# Patient Record
Sex: Male | Born: 1990 | Race: Black or African American | Hispanic: No | Marital: Single
Health system: Southern US, Academic
[De-identification: ages and names within clinical notes are randomized; demographics above are authoritative.]

## PROBLEM LIST (undated history)

## (undated) ENCOUNTER — Encounter

## (undated) ENCOUNTER — Ambulatory Visit: Payer: MEDICARE | Attending: Infectious Disease | Primary: Infectious Disease

## (undated) ENCOUNTER — Ambulatory Visit: Payer: MEDICARE | Attending: Psychiatry | Primary: Psychiatry

## (undated) ENCOUNTER — Encounter: Attending: Infectious Disease | Primary: Infectious Disease

## (undated) ENCOUNTER — Telehealth

## (undated) ENCOUNTER — Encounter: Attending: Family Medicine | Primary: Family Medicine

## (undated) ENCOUNTER — Encounter: Attending: Psychiatry | Primary: Psychiatry

## (undated) ENCOUNTER — Ambulatory Visit

## (undated) ENCOUNTER — Encounter: Payer: MEDICARE | Attending: Infectious Disease | Primary: Infectious Disease

## (undated) ENCOUNTER — Other Ambulatory Visit
Attending: Student in an Organized Health Care Education/Training Program | Primary: Student in an Organized Health Care Education/Training Program

## (undated) ENCOUNTER — Ambulatory Visit: Payer: MEDICARE

## (undated) ENCOUNTER — Telehealth: Attending: Infectious Disease | Primary: Infectious Disease

## (undated) ENCOUNTER — Ambulatory Visit
Payer: Medicare (Managed Care) | Attending: Student in an Organized Health Care Education/Training Program | Primary: Student in an Organized Health Care Education/Training Program

## (undated) ENCOUNTER — Telehealth: Attending: Registered" | Primary: Registered"

## (undated) ENCOUNTER — Encounter
Payer: MEDICARE | Attending: Student in an Organized Health Care Education/Training Program | Primary: Student in an Organized Health Care Education/Training Program

## (undated) ENCOUNTER — Telehealth: Attending: Clinical | Primary: Clinical

## (undated) ENCOUNTER — Encounter: Attending: Clinical | Primary: Clinical

## (undated) ENCOUNTER — Encounter
Attending: Student in an Organized Health Care Education/Training Program | Primary: Student in an Organized Health Care Education/Training Program

## (undated) ENCOUNTER — Encounter: Payer: MEDICARE | Attending: Psychiatry | Primary: Psychiatry

## (undated) ENCOUNTER — Encounter: Attending: Family | Primary: Family

## (undated) ENCOUNTER — Ambulatory Visit: Payer: Medicare (Managed Care) | Attending: Infectious Disease | Primary: Infectious Disease

## (undated) ENCOUNTER — Ambulatory Visit
Payer: MEDICARE | Attending: Rehabilitative and Restorative Service Providers" | Primary: Rehabilitative and Restorative Service Providers"

## (undated) ENCOUNTER — Ambulatory Visit: Payer: MEDICARE | Attending: Family | Primary: Family

## (undated) ENCOUNTER — Encounter: Attending: Registered" | Primary: Registered"

## (undated) ENCOUNTER — Non-Acute Institutional Stay: Payer: MEDICARE | Attending: Infectious Disease | Primary: Infectious Disease

## (undated) ENCOUNTER — Other Ambulatory Visit

## (undated) ENCOUNTER — Non-Acute Institutional Stay: Payer: MEDICARE

## (undated) ENCOUNTER — Encounter: Payer: MEDICARE | Attending: Family Medicine | Primary: Family Medicine

## (undated) ENCOUNTER — Telehealth
Attending: Pharmacist Clinician (PhC)/ Clinical Pharmacy Specialist | Primary: Pharmacist Clinician (PhC)/ Clinical Pharmacy Specialist

## (undated) DIAGNOSIS — B20 Human immunodeficiency virus [HIV] disease: Secondary | ICD-10-CM

---
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
## 2016-08-24 ENCOUNTER — Emergency Department
Admission: EM | Admit: 2016-08-24 | Discharge: 2016-08-24 | Disposition: A | Discharge status: Home / Self Care | Payer: Worker's Compensation | Source: Intra-hospital

## 2016-12-01 ENCOUNTER — Ambulatory Visit
Admission: RE | Admit: 2016-12-01 | Discharge: 2016-12-01 | Disposition: A | Discharge status: Home / Self Care | Admitting: Infectious Disease

## 2016-12-01 DIAGNOSIS — R198 Other specified symptoms and signs involving the digestive system and abdomen: Secondary | ICD-10-CM

## 2016-12-01 DIAGNOSIS — Z23 Encounter for immunization: Principal | ICD-10-CM

## 2016-12-01 DIAGNOSIS — R799 Abnormal finding of blood chemistry, unspecified: Secondary | ICD-10-CM

## 2016-12-01 DIAGNOSIS — Z7289 Other problems related to lifestyle: Secondary | ICD-10-CM

## 2016-12-01 DIAGNOSIS — B20 Human immunodeficiency virus [HIV] disease: Secondary | ICD-10-CM

## 2016-12-01 DIAGNOSIS — Z113 Encounter for screening for infections with a predominantly sexual mode of transmission: Secondary | ICD-10-CM

## 2016-12-01 DIAGNOSIS — Z79899 Other long term (current) drug therapy: Secondary | ICD-10-CM

## 2016-12-01 MED ORDER — DOLUTEGRAVIR 50 MG TABLET
ORAL_TABLET | Freq: Every day | ORAL | 11 refills | 0 days | Status: CP
Start: 2016-12-01 — End: 2017-12-19

## 2016-12-01 MED ORDER — EMTRICITABINE 200 MG-TENOFOVIR ALAFENAMIDE FUMARATE 25 MG TABLET
ORAL_TABLET | Freq: Every day | ORAL | 11 refills | 0 days | Status: CP
Start: 2016-12-01 — End: 2017-12-19

## 2016-12-04 ENCOUNTER — Ambulatory Visit: Admission: RE | Admit: 2016-12-04 | Discharge: 2016-12-04 | Payer: MEDICARE

## 2016-12-04 DIAGNOSIS — A539 Syphilis, unspecified: Principal | ICD-10-CM

## 2017-01-23 ENCOUNTER — Emergency Department
Admission: EM | Admit: 2017-01-23 | Discharge: 2017-01-23 | Disposition: A | Discharge status: Home / Self Care | Source: Intra-hospital

## 2017-01-23 MED ORDER — OMEPRAZOLE 20 MG CAPSULE,DELAYED RELEASE
ORAL_CAPSULE | Freq: Every day | ORAL | 0 refills | 0.00000 days | Status: CP
Start: 2017-01-23 — End: 2017-03-02

## 2017-02-05 ENCOUNTER — Ambulatory Visit
Admission: RE | Admit: 2017-02-05 | Discharge: 2017-02-05 | Attending: Student in an Organized Health Care Education/Training Program | Admitting: Student in an Organized Health Care Education/Training Program

## 2017-02-05 DIAGNOSIS — F418 Other specified anxiety disorders: Secondary | ICD-10-CM

## 2017-02-05 DIAGNOSIS — B20 Human immunodeficiency virus [HIV] disease: Secondary | ICD-10-CM

## 2017-02-05 DIAGNOSIS — A5149 Other secondary syphilitic conditions: Principal | ICD-10-CM

## 2017-02-25 ENCOUNTER — Encounter: Admit: 2017-02-25 | Discharge: 2017-02-26 | Payer: MEDICARE

## 2017-02-25 DIAGNOSIS — R42 Dizziness and giddiness: Principal | ICD-10-CM

## 2017-02-25 DIAGNOSIS — B20 Human immunodeficiency virus [HIV] disease: Secondary | ICD-10-CM

## 2017-02-25 DIAGNOSIS — R269 Unspecified abnormalities of gait and mobility: Secondary | ICD-10-CM

## 2017-02-25 DIAGNOSIS — H547 Unspecified visual loss: Secondary | ICD-10-CM

## 2017-03-02 ENCOUNTER — Encounter
Admit: 2017-03-02 | Discharge: 2017-03-03 | Payer: MEDICARE | Attending: Infectious Disease | Primary: Infectious Disease

## 2017-03-02 DIAGNOSIS — H539 Unspecified visual disturbance: Secondary | ICD-10-CM

## 2017-03-02 DIAGNOSIS — F418 Other specified anxiety disorders: Secondary | ICD-10-CM

## 2017-03-02 DIAGNOSIS — A519 Early syphilis, unspecified: Secondary | ICD-10-CM

## 2017-03-02 DIAGNOSIS — Z113 Encounter for screening for infections with a predominantly sexual mode of transmission: Secondary | ICD-10-CM

## 2017-03-02 DIAGNOSIS — K219 Gastro-esophageal reflux disease without esophagitis: Secondary | ICD-10-CM

## 2017-03-02 DIAGNOSIS — R42 Dizziness and giddiness: Secondary | ICD-10-CM

## 2017-03-02 DIAGNOSIS — B20 Human immunodeficiency virus [HIV] disease: Principal | ICD-10-CM

## 2017-03-02 DIAGNOSIS — Z7289 Other problems related to lifestyle: Secondary | ICD-10-CM

## 2017-03-02 MED ORDER — FAMOTIDINE 40 MG TABLET
ORAL_TABLET | Freq: Every day | ORAL | 5 refills | 0.00000 days | Status: CP
Start: 2017-03-02 — End: 2017-03-10

## 2017-03-06 ENCOUNTER — Encounter
Admit: 2017-03-06 | Discharge: 2017-03-07 | Payer: MEDICARE | Attending: Student in an Organized Health Care Education/Training Program | Primary: Student in an Organized Health Care Education/Training Program

## 2017-03-06 DIAGNOSIS — F39 Unspecified mood [affective] disorder: Secondary | ICD-10-CM

## 2017-03-06 DIAGNOSIS — B20 Human immunodeficiency virus [HIV] disease: Secondary | ICD-10-CM

## 2017-03-06 DIAGNOSIS — A5149 Other secondary syphilitic conditions: Principal | ICD-10-CM

## 2017-03-06 DIAGNOSIS — F418 Other specified anxiety disorders: Secondary | ICD-10-CM

## 2017-03-10 MED ORDER — OMEPRAZOLE MAGNESIUM 20 MG TABLET,DELAYED RELEASE
ORAL_TABLET | Freq: Every day | ORAL | 11 refills | 0 days | Status: CP
Start: 2017-03-10 — End: 2017-12-08

## 2017-03-13 ENCOUNTER — Encounter: Admit: 2017-03-13 | Discharge: 2017-03-14 | Payer: MEDICARE

## 2017-03-13 DIAGNOSIS — Q120 Congenital cataract: Secondary | ICD-10-CM

## 2017-03-13 DIAGNOSIS — B20 Human immunodeficiency virus [HIV] disease: Principal | ICD-10-CM

## 2017-05-25 ENCOUNTER — Encounter
Admit: 2017-05-25 | Discharge: 2017-05-26 | Disposition: A | Discharge status: Home / Self Care | Payer: MEDICARE | Attending: Family

## 2017-05-25 MED ORDER — CYCLOBENZAPRINE 10 MG TABLET
ORAL_TABLET | Freq: Two times a day (BID) | ORAL | 0 refills | 0 days | Status: CP | PRN
Start: 2017-05-25 — End: 2017-06-22

## 2017-05-28 ENCOUNTER — Encounter: Admit: 2017-05-28 | Discharge: 2017-05-29 | Payer: MEDICARE

## 2017-05-28 DIAGNOSIS — M545 Low back pain: Principal | ICD-10-CM

## 2017-05-28 DIAGNOSIS — S060X0A Concussion without loss of consciousness, initial encounter: Secondary | ICD-10-CM

## 2017-06-05 ENCOUNTER — Encounter: Admit: 2017-06-05 | Discharge: 2017-06-06 | Payer: MEDICARE | Attending: Family Medicine | Primary: Family Medicine

## 2017-06-05 DIAGNOSIS — M545 Low back pain: Principal | ICD-10-CM

## 2017-06-05 MED ORDER — GABAPENTIN 300 MG CAPSULE
ORAL_CAPSULE | Freq: Every evening | ORAL | 0 refills | 0 days | Status: CP
Start: 2017-06-05 — End: 2017-07-29

## 2017-06-22 ENCOUNTER — Encounter
Admit: 2017-06-22 | Discharge: 2017-06-23 | Payer: MEDICARE | Attending: Student in an Organized Health Care Education/Training Program | Primary: Student in an Organized Health Care Education/Training Program

## 2017-06-22 DIAGNOSIS — M545 Low back pain: Principal | ICD-10-CM

## 2017-06-22 MED ORDER — DULOXETINE 30 MG CAPSULE,DELAYED RELEASE
ORAL_CAPSULE | Freq: Every day | ORAL | 5 refills | 0 days | Status: CP
Start: 2017-06-22 — End: 2017-07-29

## 2017-06-22 MED ORDER — TRAMADOL 50 MG TABLET
ORAL_TABLET | Freq: Four times a day (QID) | ORAL | 0 refills | 0 days | Status: CP | PRN
Start: 2017-06-22 — End: 2017-06-27

## 2017-07-20 ENCOUNTER — Encounter
Admit: 2017-07-20 | Discharge: 2017-08-18 | Payer: MEDICARE | Attending: Rehabilitative and Restorative Service Providers" | Primary: Rehabilitative and Restorative Service Providers"

## 2017-07-20 DIAGNOSIS — M545 Low back pain: Principal | ICD-10-CM

## 2017-07-29 ENCOUNTER — Encounter
Admit: 2017-07-29 | Discharge: 2017-07-30 | Payer: MEDICARE | Attending: Student in an Organized Health Care Education/Training Program | Primary: Student in an Organized Health Care Education/Training Program

## 2017-07-29 DIAGNOSIS — M545 Low back pain: Principal | ICD-10-CM

## 2017-08-12 DIAGNOSIS — M545 Low back pain: Principal | ICD-10-CM

## 2017-12-03 ENCOUNTER — Encounter
Admit: 2017-12-03 | Discharge: 2017-12-03 | Disposition: A | Discharge status: Home / Self Care | Payer: MEDICARE | Attending: Student in an Organized Health Care Education/Training Program

## 2017-12-03 MED ORDER — NAPROXEN 375 MG TABLET
ORAL_TABLET | Freq: Two times a day (BID) | ORAL | 0 refills | 0 days | Status: CP
Start: 2017-12-03 — End: 2017-12-08

## 2017-12-03 MED ORDER — CYCLOBENZAPRINE 10 MG TABLET
ORAL_TABLET | Freq: Two times a day (BID) | ORAL | 0 refills | 0.00000 days | Status: CP | PRN
Start: 2017-12-03 — End: 2018-02-22

## 2017-12-06 ENCOUNTER — Encounter: Admit: 2017-12-06 | Discharge: 2017-12-06 | Disposition: A | Discharge status: Home / Self Care | Payer: MEDICARE

## 2017-12-08 ENCOUNTER — Encounter: Admit: 2017-12-08 | Discharge: 2017-12-09 | Payer: MEDICARE

## 2017-12-08 DIAGNOSIS — S8392XA Sprain of unspecified site of left knee, initial encounter: Secondary | ICD-10-CM

## 2017-12-08 DIAGNOSIS — S335XXA Sprain of ligaments of lumbar spine, initial encounter: Principal | ICD-10-CM

## 2017-12-19 ENCOUNTER — Encounter
Admit: 2017-12-19 | Discharge: 2017-12-20 | Disposition: A | Discharge status: Home / Self Care | Payer: MEDICARE | Attending: Emergency Medicine

## 2017-12-19 DIAGNOSIS — M7918 Myalgia, other site: Principal | ICD-10-CM

## 2017-12-21 MED ORDER — DESCOVY 200 MG-25 MG TABLET
ORAL_TABLET | 11 refills | 0 days | Status: CP
Start: 2017-12-21 — End: 2018-01-27

## 2017-12-21 MED ORDER — TIVICAY 50 MG TABLET
ORAL_TABLET | 11 refills | 0 days | Status: CP
Start: 2017-12-21 — End: 2018-01-27

## 2017-12-22 ENCOUNTER — Ambulatory Visit: Admit: 2017-12-22 | Discharge: 2017-12-23 | Payer: MEDICARE | Attending: Family | Primary: Family

## 2017-12-22 DIAGNOSIS — J392 Other diseases of pharynx: Secondary | ICD-10-CM

## 2017-12-22 DIAGNOSIS — H5789 Other specified disorders of eye and adnexa: Secondary | ICD-10-CM

## 2017-12-22 DIAGNOSIS — M549 Dorsalgia, unspecified: Principal | ICD-10-CM

## 2018-01-27 ENCOUNTER — Encounter: Admit: 2018-01-27 | Discharge: 2018-01-28 | Payer: MEDICARE

## 2018-01-27 DIAGNOSIS — K6289 Other specified diseases of anus and rectum: Secondary | ICD-10-CM

## 2018-01-27 DIAGNOSIS — F39 Unspecified mood [affective] disorder: Secondary | ICD-10-CM

## 2018-01-27 DIAGNOSIS — Z113 Encounter for screening for infections with a predominantly sexual mode of transmission: Secondary | ICD-10-CM

## 2018-01-27 DIAGNOSIS — B20 Human immunodeficiency virus [HIV] disease: Principal | ICD-10-CM

## 2018-01-27 DIAGNOSIS — F603 Borderline personality disorder: Secondary | ICD-10-CM

## 2018-01-27 MED ORDER — BICTEGRAVIR 50 MG-EMTRICITABINE 200 MG-TENOFOVIR ALAFENAM 25 MG TABLET
ORAL_TABLET | Freq: Every day | ORAL | 11 refills | 0 days | Status: CP
Start: 2018-01-27 — End: 2018-09-13

## 2018-02-22 ENCOUNTER — Ambulatory Visit
Admit: 2018-02-22 | Discharge: 2018-02-23 | Payer: MEDICARE | Attending: Infectious Disease | Primary: Infectious Disease

## 2018-02-22 DIAGNOSIS — B20 Human immunodeficiency virus [HIV] disease: Principal | ICD-10-CM

## 2018-02-22 DIAGNOSIS — Z79899 Other long term (current) drug therapy: Secondary | ICD-10-CM

## 2018-02-22 DIAGNOSIS — Z23 Encounter for immunization: Secondary | ICD-10-CM

## 2018-02-22 DIAGNOSIS — F1290 Cannabis use, unspecified, uncomplicated: Secondary | ICD-10-CM

## 2018-06-14 ENCOUNTER — Encounter
Admit: 2018-06-14 | Discharge: 2018-06-15 | Payer: MEDICARE | Attending: Infectious Disease | Primary: Infectious Disease

## 2018-06-14 DIAGNOSIS — B20 Human immunodeficiency virus [HIV] disease: Principal | ICD-10-CM

## 2018-06-14 DIAGNOSIS — Z79899 Other long term (current) drug therapy: Secondary | ICD-10-CM

## 2018-06-14 DIAGNOSIS — Z113 Encounter for screening for infections with a predominantly sexual mode of transmission: Secondary | ICD-10-CM

## 2018-06-14 DIAGNOSIS — Z23 Encounter for immunization: Secondary | ICD-10-CM

## 2018-06-14 DIAGNOSIS — A549 Gonococcal infection, unspecified: Secondary | ICD-10-CM

## 2018-06-14 DIAGNOSIS — K219 Gastro-esophageal reflux disease without esophagitis: Secondary | ICD-10-CM

## 2018-06-14 MED ORDER — OMEPRAZOLE 10 MG CAPSULE,DELAYED RELEASE: 10 mg | capsule | Freq: Every day | 2 refills | 0 days | Status: AC

## 2018-06-14 MED ORDER — OMEPRAZOLE 10 MG CAPSULE,DELAYED RELEASE
ORAL_CAPSULE | Freq: Every day | ORAL | 3 refills | 0.00000 days | Status: CP
Start: 2018-06-14 — End: 2018-06-14

## 2018-06-16 ENCOUNTER — Encounter: Admit: 2018-06-16 | Discharge: 2018-06-17 | Payer: MEDICARE

## 2018-06-16 ENCOUNTER — Institutional Professional Consult (permissible substitution): Admit: 2018-06-16 | Discharge: 2018-06-17 | Payer: MEDICARE

## 2018-06-16 DIAGNOSIS — B20 Human immunodeficiency virus [HIV] disease: Principal | ICD-10-CM

## 2018-06-16 DIAGNOSIS — Z113 Encounter for screening for infections with a predominantly sexual mode of transmission: Principal | ICD-10-CM

## 2018-06-16 DIAGNOSIS — Z79899 Other long term (current) drug therapy: Secondary | ICD-10-CM

## 2018-06-16 DIAGNOSIS — Z23 Encounter for immunization: Secondary | ICD-10-CM

## 2018-09-13 ENCOUNTER — Encounter
Admit: 2018-09-13 | Discharge: 2018-09-14 | Payer: MEDICARE | Attending: Infectious Disease | Primary: Infectious Disease

## 2018-09-13 DIAGNOSIS — Z79899 Other long term (current) drug therapy: Secondary | ICD-10-CM

## 2018-09-13 DIAGNOSIS — F418 Other specified anxiety disorders: Secondary | ICD-10-CM

## 2018-09-13 DIAGNOSIS — K219 Gastro-esophageal reflux disease without esophagitis: Secondary | ICD-10-CM

## 2018-09-13 DIAGNOSIS — B20 Human immunodeficiency virus [HIV] disease: Principal | ICD-10-CM

## 2018-09-13 DIAGNOSIS — F1290 Cannabis use, unspecified, uncomplicated: Secondary | ICD-10-CM

## 2018-09-13 DIAGNOSIS — Z7289 Other problems related to lifestyle: Secondary | ICD-10-CM

## 2018-09-13 MED ORDER — BICTEGRAVIR 50 MG-EMTRICITABINE 200 MG-TENOFOVIR ALAFENAM 25 MG TABLET
ORAL_TABLET | Freq: Every day | ORAL | 11 refills | 30.00000 days | Status: CP
Start: 2018-09-13 — End: ?

## 2018-09-26 DIAGNOSIS — A539 Syphilis, unspecified: Secondary | ICD-10-CM | POA: Insufficient documentation

## 2018-09-26 DIAGNOSIS — S0181XA Laceration without foreign body of other part of head, initial encounter: Secondary | ICD-10-CM

## 2018-09-26 DIAGNOSIS — S41112A Laceration without foreign body of left upper arm, initial encounter: Secondary | ICD-10-CM

## 2018-09-26 DIAGNOSIS — Z91419 Personal history of unspecified adult abuse: Secondary | ICD-10-CM | POA: Insufficient documentation

## 2018-09-26 HISTORY — DX: Laceration without foreign body of left upper arm, initial encounter: S41.112A

## 2018-09-26 HISTORY — DX: Laceration without foreign body of other part of head, initial encounter: S01.81XA

## 2018-10-12 ENCOUNTER — Encounter: Admit: 2018-10-12 | Discharge: 2018-10-13 | Payer: MEDICARE

## 2018-10-12 DIAGNOSIS — B20 Human immunodeficiency virus [HIV] disease: Principal | ICD-10-CM

## 2018-10-12 DIAGNOSIS — S8392XA Sprain of unspecified site of left knee, initial encounter: Secondary | ICD-10-CM

## 2018-10-12 DIAGNOSIS — S335XXA Sprain of ligaments of lumbar spine, initial encounter: Secondary | ICD-10-CM

## 2018-10-12 DIAGNOSIS — G8911 Acute pain due to trauma: Secondary | ICD-10-CM

## 2018-10-12 DIAGNOSIS — S31041D Puncture wound with foreign body of lower back and pelvis with penetration into retroperitoneum, subsequent encounter: Secondary | ICD-10-CM

## 2018-10-12 HISTORY — DX: Acute pain due to trauma: G89.11

## 2018-10-12 HISTORY — DX: Puncture wound with foreign body of lower back and pelvis with penetration into retroperitoneum, subsequent encounter: S31.041D

## 2018-11-01 ENCOUNTER — Encounter
Admit: 2018-11-01 | Discharge: 2018-11-02 | Payer: MEDICARE | Attending: Infectious Disease | Primary: Infectious Disease

## 2018-11-01 DIAGNOSIS — F4311 Post-traumatic stress disorder, acute: Secondary | ICD-10-CM

## 2018-11-01 DIAGNOSIS — T7491XA Unspecified adult maltreatment, confirmed, initial encounter: Secondary | ICD-10-CM

## 2018-11-01 DIAGNOSIS — B20 Human immunodeficiency virus [HIV] disease: Secondary | ICD-10-CM

## 2018-11-01 DIAGNOSIS — F5102 Adjustment insomnia: Secondary | ICD-10-CM

## 2018-11-01 DIAGNOSIS — Z23 Encounter for immunization: Secondary | ICD-10-CM

## 2018-11-01 MED ORDER — BICTEGRAVIR 50 MG-EMTRICITABINE 200 MG-TENOFOVIR ALAFENAM 25 MG TABLET
ORAL_TABLET | Freq: Every day | ORAL | 11 refills | 30 days | Status: CP
Start: 2018-11-01 — End: ?

## 2018-11-01 MED ORDER — ZOLPIDEM 5 MG TABLET
ORAL_TABLET | Freq: Every day | ORAL | 1 refills | 7 days | Status: CP
Start: 2018-11-01 — End: 2018-11-12

## 2018-11-12 ENCOUNTER — Ambulatory Visit: Admit: 2018-11-12 | Discharge: 2018-11-13 | Payer: MEDICARE | Attending: Psychiatry | Primary: Psychiatry

## 2018-11-12 DIAGNOSIS — F418 Other specified anxiety disorders: Secondary | ICD-10-CM

## 2018-11-12 DIAGNOSIS — F4311 Post-traumatic stress disorder, acute: Secondary | ICD-10-CM

## 2018-11-12 DIAGNOSIS — F1290 Cannabis use, unspecified, uncomplicated: Secondary | ICD-10-CM

## 2018-11-12 DIAGNOSIS — T7491XA Unspecified adult maltreatment, confirmed, initial encounter: Secondary | ICD-10-CM

## 2018-11-12 DIAGNOSIS — F5102 Adjustment insomnia: Secondary | ICD-10-CM

## 2018-11-12 DIAGNOSIS — F4312 Post-traumatic stress disorder, chronic: Secondary | ICD-10-CM

## 2018-11-12 DIAGNOSIS — B20 Human immunodeficiency virus [HIV] disease: Secondary | ICD-10-CM

## 2018-11-12 MED ORDER — PRAZOSIN 1 MG CAPSULE
ORAL_CAPSULE | 0 refills | 0 days | Status: CP
Start: 2018-11-12 — End: ?

## 2018-11-12 MED ORDER — CITALOPRAM 20 MG TABLET
ORAL_TABLET | 0 refills | 0 days | Status: CP
Start: 2018-11-12 — End: ?

## 2018-12-03 ENCOUNTER — Telehealth: Admit: 2018-12-03 | Discharge: 2018-12-04 | Payer: MEDICARE

## 2018-12-03 MED ORDER — CITALOPRAM 20 MG TABLET: 20 mg | tablet | Freq: Every day | 0 refills | 30 days | Status: AC

## 2019-03-06 MED ORDER — CITALOPRAM 20 MG TABLET
ORAL_TABLET | 0 refills | 0 days | Status: CP
Start: 2019-03-06 — End: ?

## 2019-03-17 ENCOUNTER — Emergency Department
Admission: EM | Admit: 2019-03-17 | Discharge: 2019-03-17 | Disposition: A | Discharge status: Home / Self Care | Payer: Medicare HMO | Attending: Student | Admitting: Student

## 2019-03-17 ENCOUNTER — Encounter: Payer: Self-pay | Admitting: Emergency Medicine

## 2019-03-17 ENCOUNTER — Other Ambulatory Visit: Payer: Self-pay

## 2019-03-17 DIAGNOSIS — Z79899 Other long term (current) drug therapy: Secondary | ICD-10-CM | POA: Insufficient documentation

## 2019-03-17 DIAGNOSIS — Z21 Asymptomatic human immunodeficiency virus [HIV] infection status: Secondary | ICD-10-CM | POA: Insufficient documentation

## 2019-03-17 DIAGNOSIS — F4325 Adjustment disorder with mixed disturbance of emotions and conduct: Secondary | ICD-10-CM | POA: Diagnosis not present

## 2019-03-17 DIAGNOSIS — F919 Conduct disorder, unspecified: Secondary | ICD-10-CM | POA: Diagnosis present

## 2019-03-17 DIAGNOSIS — F4310 Post-traumatic stress disorder, unspecified: Secondary | ICD-10-CM | POA: Insufficient documentation

## 2019-03-17 DIAGNOSIS — R45851 Suicidal ideations: Secondary | ICD-10-CM | POA: Insufficient documentation

## 2019-03-17 DIAGNOSIS — R4689 Other symptoms and signs involving appearance and behavior: Secondary | ICD-10-CM

## 2019-03-17 LAB — CBC
HCT: 41.4 % (ref 39.0–52.0)
Hemoglobin: 13.3 g/dL (ref 13.0–17.0)
MCH: 25 pg — ABNORMAL LOW (ref 26.0–34.0)
MCHC: 32.1 g/dL (ref 30.0–36.0)
MCV: 77.7 fL — ABNORMAL LOW (ref 80.0–100.0)
Platelets: 251 10*3/uL (ref 150–400)
RBC: 5.33 MIL/uL (ref 4.22–5.81)
RDW: 18.2 % — ABNORMAL HIGH (ref 11.5–15.5)
WBC: 6 10*3/uL (ref 4.0–10.5)
nRBC: 0 % (ref 0.0–0.2)

## 2019-03-17 LAB — URINE DRUG SCREEN, QUALITATIVE (ARMC ONLY)
Amphetamines, Ur Screen: NOT DETECTED
Barbiturates, Ur Screen: NOT DETECTED
Benzodiazepine, Ur Scrn: NOT DETECTED
Cannabinoid 50 Ng, Ur ~~LOC~~: POSITIVE — AB
Cocaine Metabolite,Ur ~~LOC~~: NOT DETECTED
MDMA (Ecstasy)Ur Screen: NOT DETECTED
Methadone Scn, Ur: NOT DETECTED
Opiate, Ur Screen: NOT DETECTED
Phencyclidine (PCP) Ur S: NOT DETECTED
Tricyclic, Ur Screen: NOT DETECTED

## 2019-03-17 LAB — COMPREHENSIVE METABOLIC PANEL
ALT: 20 U/L (ref 0–44)
AST: 29 U/L (ref 15–41)
Albumin: 4.7 g/dL (ref 3.5–5.0)
Alkaline Phosphatase: 51 U/L (ref 38–126)
Anion gap: 6 (ref 5–15)
BUN: 15 mg/dL (ref 6–20)
CO2: 26 mmol/L (ref 22–32)
Calcium: 9.3 mg/dL (ref 8.9–10.3)
Chloride: 106 mmol/L (ref 98–111)
Creatinine, Ser: 0.84 mg/dL (ref 0.61–1.24)
GFR calc Af Amer: >60 mL/min (ref >60)
GFR calc non Af Amer: >60 mL/min (ref >60)
Glucose, Bld: 96 mg/dL (ref 70–99)
Potassium: 3.6 mmol/L (ref 3.5–5.1)
Sodium: 138 mmol/L (ref 135–145)
Total Bilirubin: 1.6 mg/dL — ABNORMAL HIGH (ref 0.3–1.2)
Total Protein: 7.3 g/dL (ref 6.5–8.1)

## 2019-03-17 LAB — SALICYLATE LEVEL: Salicylate Lvl: <7 mg/dL — ABNORMAL LOW (ref 7.0–30.0)

## 2019-03-17 LAB — ETHANOL: Alcohol, Ethyl (B): <10 mg/dL (ref <10)

## 2019-03-17 LAB — ACETAMINOPHEN LEVEL: Acetaminophen (Tylenol), Serum: <10 ug/mL — ABNORMAL LOW (ref 10–30)

## 2019-03-17 NOTE — Discharge Instructions (Addendum)
Follow-up with your regular doctor and psychiatrist.    Please continue to take your regularly prescribed medications.  Return to the ER for new or recurrent thoughts of suicide, any other thoughts of wanting to hurt yourself or anyone else, or any other new or worsening symptoms that concern you.
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
## 2019-03-17 NOTE — ED Notes (Signed)
IVC, pend SOC 

## 2019-03-17 NOTE — ED Notes (Addendum)
Black wallet (pt states he doesn't need this locked up), gray jeans, black tennis shoes, gray socks, gray beanie, car keys, purple shorts, black belt, gray and black sweater, red t shirt, white t shirt, white wife beater, one earring (other is at home), striped underwear, placed in hospital belongings bag, black iphone (turned off), labled and given to quad RN. 4 bottles of medication placed in belongings bag as well and RN made aware.

## 2019-03-17 NOTE — ED Notes (Signed)
Reviewed discharge instructions and follow-up care with patient. Patient verbalized understanding of all information reviewed. Patient stable, with no distress noted at this time.    

## 2019-03-17 NOTE — ED Notes (Signed)
Pt stating he is ready to be discharged. RN explained to pt he needed to be seen by psychiatrist. Pt accepting.
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
## 2019-03-17 NOTE — ED Notes (Signed)
Red,green,and purple top tubes sent to lab.

## 2019-03-17 NOTE — ED Notes (Signed)
SW from [facility] called.  States there is a note in Care Everywhere regarding concerns.  SW is also available if needed.  Direct phone number:  [phone]

## 2019-03-17 NOTE — ED Provider Notes (Signed)
[facility] Emergency Department Provider Note  ____________________________________________   First MD Initiated Contact with Patient 03/17/19 1530     (approximate)  I have reviewed the triage vital signs and the nursing notes.  History  Chief Complaint Psychiatric Evaluation    HPI Deagen Keltner is a 29 y.o. male with hx of HIV, PTSD who presents emergency department for psychiatric evaluation.  History provided by patient, as well as patient's SW via phone.   Discussed with patient's SW from [facility], [phone]). Apparently today patient's cousin told him he was going to stop helping care for the patient (helps around the house, etc). Patient reportedly stated to his SW "the moment I see him, I'm going to hurt him." SW asked if he had any weapons in the home, patient stated he didn't have any weapons but "I have a kitchen knife."   SW was able to hear through the phone a verbal argument between the patient and cousin, does not think physical altercation occurred. Later patient voiced to the SW the desire to hurt himself by breaking glass/cutting himself.   On my evaluation, patient does admit to making statements about wanting to hurt his cousin and hurt himself earlier in the day.  He states this was in the setting of getting into an argument with his cousin and getting heated.  He states he often goes from 0 to 2000 and the only way he can express himself is by yelling/exploding.  He states now that he has calmed down, he is no longer feeling SI or HI.   Past Medical Hx HIV PTSD  Problem List HIV PTSD  Past Surgical Hx   Medications Bictarvy Citalopram  Allergies Patient has no known allergies.  Family Hx No family history on file.  Social Hx Social History   Tobacco Use  . Smoking status: Never Smoker  . Smokeless tobacco: Never Used  Substance Use Topics  . Alcohol use: Yes    Comment: 2 beer daily  . Drug use: Yes   Types: Marijuana     Review of Systems  Constitutional: Negative for fever, chills. Eyes: Negative for visual changes. ENT: Negative for sore throat. Cardiovascular: Negative for chest pain. Respiratory: Negative for shortness of breath. Gastrointestinal: Negative for nausea, vomiting.  Genitourinary: Negative for dysuria. Musculoskeletal: Negative for leg swelling. Skin: Negative for rash. Neurological: Negative for for headaches.   Physical Exam  Vital Signs: ED Triage Vitals  Enc Vitals Group     BP 03/17/19 1457 115/69     Pulse Rate 03/17/19 1457 75     Resp 03/17/19 1457 19     Temp 03/17/19 1457 98 F (36.7 C)     Temp Source 03/17/19 1457 Oral     SpO2 03/17/19 1457 99 %     Weight 03/17/19 1456 145 lb (65.8 kg)     Height 03/17/19 1456 5\' 9"  (1.753 m)     Head Circumference --      Peak Flow --      Pain Score 03/17/19 1515 0     Pain Loc --      Pain Edu? --      Excl. in GC? --     Constitutional: Alert and oriented.  Head: Normocephalic. Atraumatic. Eyes: Conjunctivae clear. Sclera anicteric. Nose: No congestion. No rhinorrhea. Mouth/Throat: Mucous membranes are moist.  Neck: No stridor.   Cardiovascular: Normal rate. Extremities well perfused. Respiratory: Normal respiratory effort.   Gastrointestinal: Non-distended.  Musculoskeletal: No deformities.  Neurologic:  Normal speech and language. No gross focal neurologic deficits are appreciated.  Skin: Skin is warm, dry and intact.  Psychiatric: Calm and cooperative. Admits to making SI/HI statements earlier, but denies these thoughts currently.   EKG  N/A    Radiology  N/A   Procedures  Procedure(s) performed (including critical care):  Procedures   Initial Impression / Assessment and Plan / ED Course  29 y.o. male who presents to the ED for aggression, SI, HI statements made earlier. As above.   Will obtain basic screening labs and consult psychiatry and TTS. Given his concerning  statements, witnessed by the SW, will place under IVC.   Labs w/o actionable derangements. No evidence of underlying metabolic, infectious, or toxicologic etiology.   8:45 PM Patient has been seen and evaluated by psychiatry team (via telepsych) and deemed appropriate for discharge. Patient determined not to be a threat to themselves or others. IVC rescinded by psychiatry team. Patient is otherwise medically clear.   Will plan for discharge with outpatient follow up.    Final Clinical Impression(s) / ED Diagnosis  Final diagnoses:  Aggression       Note:  This document was prepared using Dragon voice recognition software and may include unintentional dictation errors.   Lilia Pro., MD 03/18/19 [phone]

## 2019-03-17 NOTE — ED Notes (Signed)
Pt given dinner tray.

## 2019-03-18 ENCOUNTER — Encounter: Admit: 2019-03-18 | Discharge: 2019-03-19 | Payer: MEDICARE

## 2019-03-18 MED ORDER — CITALOPRAM 40 MG TABLET
ORAL_TABLET | Freq: Every day | ORAL | 1 refills | 90.00000 days | Status: CP
Start: 2019-03-18 — End: ?

## 2019-07-08 ENCOUNTER — Ambulatory Visit: Admit: 2019-07-08 | Payer: MEDICARE

## 2019-09-26 ENCOUNTER — Ambulatory Visit: Admit: 2019-09-26 | Payer: MEDICARE | Attending: Infectious Disease | Primary: Infectious Disease

## 2019-10-31 ENCOUNTER — Encounter: Admit: 2019-10-31 | Discharge: 2019-11-01 | Payer: MEDICARE | Attending: Family Medicine | Primary: Family Medicine

## 2019-11-10 ENCOUNTER — Encounter: Admit: 2019-11-10 | Discharge: 2019-11-10 | Payer: MEDICARE | Attending: Family | Primary: Family

## 2019-11-10 ENCOUNTER — Ambulatory Visit: Admit: 2019-11-10 | Discharge: 2019-11-10 | Payer: MEDICARE

## 2019-11-10 ENCOUNTER — Ambulatory Visit: Admit: 2019-11-10 | Discharge: 2019-11-10 | Payer: MEDICARE | Attending: Psychiatry | Primary: Psychiatry

## 2019-11-10 ENCOUNTER — Encounter: Admit: 2019-11-10 | Discharge: 2019-11-10 | Payer: MEDICARE

## 2019-11-10 DIAGNOSIS — B20 Human immunodeficiency virus [HIV] disease: Principal | ICD-10-CM

## 2019-11-10 DIAGNOSIS — F5102 Adjustment insomnia: Principal | ICD-10-CM

## 2019-11-10 DIAGNOSIS — F4312 Post-traumatic stress disorder, chronic: Principal | ICD-10-CM

## 2019-11-10 DIAGNOSIS — F418 Other specified anxiety disorders: Principal | ICD-10-CM

## 2019-11-10 MED ORDER — PRAZOSIN 2 MG CAPSULE
ORAL_CAPSULE | Freq: Every evening | ORAL | 5 refills | 60.00000 days | Status: CP
Start: 2019-11-10 — End: ?

## 2019-11-10 MED ORDER — BICTEGRAVIR 50 MG-EMTRICITABINE 200 MG-TENOFOVIR ALAFENAM 25 MG TABLET
ORAL_TABLET | Freq: Every day | ORAL | 5 refills | 30 days | Status: CP
Start: 2019-11-10 — End: 2020-05-08

## 2019-12-08 ENCOUNTER — Ambulatory Visit: Admit: 2019-12-08 | Discharge: 2019-12-09 | Payer: MEDICARE | Attending: Psychiatry | Primary: Psychiatry

## 2019-12-16 ENCOUNTER — Other Ambulatory Visit: Payer: Self-pay

## 2019-12-16 ENCOUNTER — Emergency Department
Admission: EM | Admit: 2019-12-16 | Discharge: 2019-12-16 | Disposition: A | Discharge status: Home / Self Care | Payer: Medicare HMO | Attending: Emergency Medicine | Admitting: Emergency Medicine

## 2019-12-16 ENCOUNTER — Emergency Department: Payer: Medicare HMO

## 2019-12-16 DIAGNOSIS — W541XXA Struck by dog, initial encounter: Secondary | ICD-10-CM | POA: Insufficient documentation

## 2019-12-16 DIAGNOSIS — S62316A Displaced fracture of base of fifth metacarpal bone, right hand, initial encounter for closed fracture: Secondary | ICD-10-CM | POA: Insufficient documentation

## 2019-12-16 DIAGNOSIS — S62339A Displaced fracture of neck of unspecified metacarpal bone, initial encounter for closed fracture: Secondary | ICD-10-CM

## 2019-12-16 DIAGNOSIS — S6991XA Unspecified injury of right wrist, hand and finger(s), initial encounter: Secondary | ICD-10-CM | POA: Diagnosis present

## 2019-12-16 MED ORDER — NAPROXEN 500 MG PO TABS
500.0000 mg | ORAL_TABLET | Freq: Once | ORAL | Status: AC
  Administered 2019-12-16: 500 mg via ORAL
  Filled 2019-12-16: qty 1

## 2019-12-16 MED ORDER — NAPROXEN 500 MG PO TABS: 500.0000 mg | ORAL_TABLET | Freq: Two times a day (BID) | ORAL | Status: DC

## 2019-12-16 NOTE — ED Provider Notes (Signed)
[facility] Emergency Department Provider Note   ____________________________________________   First MD Initiated Contact with Patient 12/16/19 1257     (approximate)  I have reviewed the triage vital signs and the nursing notes.   HISTORY  Chief Complaint Right hand pain    HPI Spencer Klein is a 29 y.o. male patient complain of right hand pain edema secondary to striking his dog 1 week ago.  Patient that he was playing with the animal when the incident occurred.  Denies loss sensation.  Patient states decreased range of motion with flexion of the fifth right finger.  No bite marks from the incident.  Rates pain as a 10/10.  No palliative measure for complaint.         No past medical history on file.  There are no problems to display for this patient.   No past surgical history on file.  Prior to Admission medications   Medication Sig Start Date End Date Taking? Authorizing Provider  naproxen (NAPROSYN) 500 MG tablet Take 1 tablet (500 mg total) by mouth 2 (two) times daily with a meal. 12/16/19   Joni Reining, PA-C    Allergies Patient has no known allergies.  No family history on file.  Social History Social History   Tobacco Use  . Smoking status: Never Smoker  . Smokeless tobacco: Never Used  Substance Use Topics  . Alcohol use: Yes    Comment: 2 beer daily  . Drug use: Yes    Types: Marijuana    Review of Systems Constitutional: No fever/chills Eyes: No visual changes. ENT: No sore throat. Cardiovascular: Denies chest pain. Respiratory: Denies shortness of breath. Gastrointestinal: No abdominal pain.  No nausea, no vomiting.  No diarrhea.  No constipation. Genitourinary: Negative for dysuria. Musculoskeletal: Right hand pain and edema.   Skin: Negative for rash. Neurological: Negative for headaches, focal weakness or numbness.   ____________________________________________   PHYSICAL EXAM:  VITAL  SIGNS: ED Triage Vitals  Enc Vitals Group     BP      Pulse      Resp      Temp      Temp src      SpO2      Weight      Height      Head Circumference      Peak Flow      Pain Score      Pain Loc      Pain Edu?      Excl. in GC?    Constitutional: Alert and oriented. Well appearing and in no acute distress. Cardiovascular: Normal rate, regular rhythm. Grossly normal heart sounds.  Good peripheral circulation. Respiratory: Normal respiratory effort.  No retractions. Lungs CTAB. Musculoskeletal: No obvious deformity to the right hand.  Moderate edema is appreciated to the fifth left metacarpal. Neurologic:  Normal speech and language. No gross focal neurologic deficits are appreciated. No gait instability. Skin:  Skin is warm, dry and intact. No rash noted. Psychiatric: Mood and affect are normal. Speech and behavior are normal.  ____________________________________________   LABS (all labs ordered are listed, but only abnormal results are displayed)  Labs Reviewed - No data to display ____________________________________________  EKG   ____________________________________________  RADIOLOGY I, Joni Reining, personally viewed and evaluated these images (plain radiographs) as part of my medical decision making, as well as reviewing the written report by the radiologist. Patient x-rays consistent with a right metatarsal head fracture ED  MD interpretation:    Official radiology report(s): DG Hand Complete Right  Result Date: 12/16/2019 CLINICAL DATA:  Contusion. EXAM: RIGHT HAND - COMPLETE 3+ VIEW COMPARISON:  No prior. FINDINGS: Angulated fracture of the distal right fifth metacarpal noted. Ulnar styloid scratched it displaced ulnar styloid fracture noted. No other bony abnormalities identified. No radiopaque foreign body. IMPRESSION: Angulated fracture of the distal right fifth metacarpal. Displaced right ulnar styloid fracture. Electronically Signed   By: Maisie Fus   Register   On: 12/16/2019 14:04    ____________________________________________   PROCEDURES  Procedure(s) performed (including Critical Care):  Procedures   ____________________________________________   INITIAL IMPRESSION / ASSESSMENT AND PLAN / ED COURSE  As part of my medical decision making, I reviewed the following data within the electronic MEDICAL RECORD NUMBER         Patient presents with right hand pain edema secondary to hitting his dog 1 week ago.  Discussed x-ray findings with patient consistent with metatarsal head fracture of the fifth digit right hand.  Patient placed in ulnar gutter splint and advised to follow-up orthopedic for definitive evaluation and treatment.      ____________________________________________   FINAL CLINICAL IMPRESSION(S) / ED DIAGNOSES  Final diagnoses:  Closed boxer's fracture, initial encounter     ED Discharge Orders         Ordered    naproxen (NAPROSYN) 500 MG tablet  2 times daily with meals        12/16/19 1405          *Please note:  Mohammedali E Igo was evaluated in Emergency Department on 12/16/2019 for the symptoms described in the history of present illness. He was evaluated in the context of the global COVID-19 pandemic, which necessitated consideration that the patient might be at risk for infection with the SARS-CoV-2 virus that causes COVID-19. Institutional protocols and algorithms that pertain to the evaluation of patients at risk for COVID-19 are in a state of rapid change based on information released by regulatory bodies including the CDC and federal and state organizations. These policies and algorithms were followed during the patient's care in the ED.  Some ED evaluations and interventions may be delayed as a result of limited staffing during and the pandemic.*   Note:  This document was prepared using Dragon voice recognition software and may include unintentional dictation errors.    Joni Reining,  PA-C 12/16/19 1408    Chesley Noon, MD 12/17/19 [phone]

## 2019-12-16 NOTE — Discharge Instructions (Addendum)
Wear splint until evaluation by orthopedics.  May remove for hygiene purposes.

## 2019-12-16 NOTE — ED Triage Notes (Signed)
Pt here via POV with complaints on right hand pain and swelling. Pt states he was playing with his dog when his fourth and fifth knuckle hit the dogs teeth. Swelling present to knuckles. Pt reports he felt a "pop". Pt reports unable to work due to the pain.

## 2019-12-16 NOTE — ED Notes (Addendum)
Ulnar gutter applied to right hand. CMS intact.

## 2020-02-05 DIAGNOSIS — F4312 Post-traumatic stress disorder, chronic: Principal | ICD-10-CM

## 2020-02-06 DIAGNOSIS — F4312 Post-traumatic stress disorder, chronic: Principal | ICD-10-CM

## 2020-02-07 MED ORDER — CITALOPRAM 40 MG TABLET
ORAL_TABLET | Freq: Every day | ORAL | 1 refills | 90.00000 days | Status: CP
Start: 2020-02-07 — End: ?

## 2020-02-08 MED ORDER — CITALOPRAM 40 MG TABLET
ORAL_TABLET | 0 refills | 0 days
Start: 2020-02-08 — End: ?

## 2020-03-09 ENCOUNTER — Encounter: Payer: Self-pay | Admitting: Emergency Medicine

## 2020-03-09 ENCOUNTER — Ambulatory Visit
Admission: EM | Admit: 2020-03-09 | Discharge: 2020-03-09 | Disposition: A | Discharge status: Home / Self Care | Payer: Medicare HMO | Attending: Family Medicine | Admitting: Family Medicine

## 2020-03-09 ENCOUNTER — Other Ambulatory Visit: Payer: Self-pay

## 2020-03-09 DIAGNOSIS — W503XXA Accidental bite by another person, initial encounter: Secondary | ICD-10-CM | POA: Diagnosis not present

## 2020-03-09 DIAGNOSIS — S61259A Open bite of unspecified finger without damage to nail, initial encounter: Secondary | ICD-10-CM

## 2020-03-09 HISTORY — DX: Human immunodeficiency virus (HIV) disease: B20

## 2020-03-09 MED ORDER — AMOXICILLIN-POT CLAVULANATE 875-125 MG PO TABS: 1.0000 | ORAL_TABLET | Freq: Two times a day (BID) | ORAL | 0 refills | Status: DC

## 2020-03-09 MED ORDER — TETANUS-DIPHTH-ACELL PERTUSSIS 5-2.5-18.5 LF-MCG/0.5 IM SUSY: 0.5000 mL | PREFILLED_SYRINGE | Freq: Once | INTRAMUSCULAR | Status: DC
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
## 2020-03-09 NOTE — Discharge Instructions (Signed)
Keep the area clean.  Antibiotic as prescribed.  Take care  Dr. Adriana Simas

## 2020-03-15 ENCOUNTER — Ambulatory Visit: Admit: 2020-03-15 | Discharge: 2020-03-15 | Payer: MEDICARE | Attending: Family | Primary: Family

## 2020-03-15 ENCOUNTER — Ambulatory Visit: Admit: 2020-03-15 | Discharge: 2020-03-15 | Payer: MEDICARE

## 2020-03-15 DIAGNOSIS — B20 Human immunodeficiency virus [HIV] disease: Principal | ICD-10-CM

## 2020-03-15 DIAGNOSIS — A539 Syphilis, unspecified: Principal | ICD-10-CM

## 2020-03-15 DIAGNOSIS — R197 Diarrhea, unspecified: Principal | ICD-10-CM

## 2020-03-15 DIAGNOSIS — Z79899 Other long term (current) drug therapy: Principal | ICD-10-CM

## 2020-03-15 DIAGNOSIS — Z113 Encounter for screening for infections with a predominantly sexual mode of transmission: Principal | ICD-10-CM

## 2020-03-15 MED ORDER — BICTEGRAVIR 50 MG-EMTRICITABINE 200 MG-TENOFOVIR ALAFENAM 25 MG TABLET
ORAL_TABLET | Freq: Every day | ORAL | 5 refills | 30.00000 days | Status: CP
Start: 2020-03-15 — End: 2020-09-11
  Filled 2020-05-16: qty 30, 30d supply, fill #0
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
## 2020-04-05 NOTE — Unmapped (Signed)
[facility] Specialty Medication Onboarding    Specialty Medication: Biktarvy 50-200-25 mg tablet   Prior Authorization: Not Required   Financial Assistance: No - copay  <$25  Final Copay/Day Supply: $9.85 / 30 day supply    Insurance Restrictions: None     Notes to Pharmacist:     The triage team has completed the benefits investigation and has determined that the patient is able to fill this medication at [facility]. Please contact the patient to complete the onboarding or follow up with the prescribing physician as needed.

## 2020-04-06 NOTE — Unmapped (Signed)
Update:    Our Same Day Courier attempted to deliver Mr. Gio Janoski but did not have the code to enter the gate.  Mr. Prabhakar did not tell me that a code was needed when I onboarded him to receive the medication yesterday.  I tried to call but his voicemail is not set up.  I will reach out to his provider.    Corliss Skains. [place], [place].D.  Specialty Pharmacist  [facility]  [phone] option 4

## 2020-05-10 NOTE — Unmapped (Signed)
[facility] Refill Coordination Note    Specialty Medication(s) to be Shipped:   Infectious Disease: Biktarvy    Other medication(s) to be shipped: No additional medications requested for fill at this time     Fran Lowes, DOB: February 17, 1991  Phone: There are no phone numbers on file.      All above HIPAA information was verified with patient.     Was a Nurse, learning disability used for this call? No    Completed refill call assessment today to schedule patient's medication shipment from the [facility] [phone]).       Specialty medication(s) and dose(s) confirmed: Regimen is correct and unchanged.   Changes to medications: Siddharth reports no changes at this time.  Changes to insurance: No  Questions for the pharmacist: No    Confirmed patient received a Conservation officer, historic buildings and a Surveyor, mining with first shipment. The patient will receive a drug information handout for each medication shipped and additional FDA Medication Guides as required.       DISEASE/MEDICATION-SPECIFIC INFORMATION        N/A    SPECIALTY MEDICATION ADHERENCE     Medication Adherence    Specialty Medication: biktarvy 50-200-25mg   Patient is on additional specialty medications: No  Patient is on more than two specialty medications: No  Any gaps in refill history greater than 2 weeks in the last 3 months: no  Demonstrates understanding of importance of adherence: yes  Informant: patient  Reliability of informant: reliable  Provider-estimated medication adherence level: good  Patient is at risk for Non-Adherence: No                biktarvy 50-200-25 mg: 1 days of medicine on hand         SHIPPING     Shipping address confirmed in Epic.     Delivery Scheduled: Yes, Expected medication delivery date: 03/25.     Medication will be delivered via Same Day Courier to the prescription address in Epic WAM.    Antonietta Barcelona   [facility] Specialty Technician

## 2020-05-11 NOTE — Unmapped (Addendum)
Fran Lowes 's Biktarvy shipment will be canceled  as a result of Courier returned package . Patient was not home     I have reached out to the patient  at [phone] and communicated the delivery change. We will not reschedule the medication and have removed this/these medication(s) from the work request.  We have canceled this work request.

## 2020-05-14 DIAGNOSIS — F4312 Post-traumatic stress disorder, chronic: Principal | ICD-10-CM

## 2020-05-14 MED ORDER — CITALOPRAM 40 MG TABLET
ORAL_TABLET | Freq: Every day | ORAL | 1 refills | 90 days | Status: CP
Start: 2020-05-14 — End: ?
  Filled 2020-05-16: qty 90, 90d supply, fill #0

## 2020-05-16 NOTE — Unmapped (Signed)
Spoke with patient. Rescheduled delivery for same day courier to prescription address, for delivery on 3/30. No questions for pharmacist.

## 2020-05-21 ENCOUNTER — Ambulatory Visit: Admit: 2020-05-21 | Discharge: 2020-05-22 | Payer: MEDICARE

## 2020-05-21 ENCOUNTER — Ambulatory Visit
Admit: 2020-05-21 | Discharge: 2020-05-22 | Payer: MEDICARE | Attending: Infectious Disease | Primary: Infectious Disease

## 2020-05-21 DIAGNOSIS — B20 Human immunodeficiency virus [HIV] disease: Principal | ICD-10-CM

## 2020-05-21 DIAGNOSIS — F4312 Post-traumatic stress disorder, chronic: Principal | ICD-10-CM

## 2020-05-21 DIAGNOSIS — Z113 Encounter for screening for infections with a predominantly sexual mode of transmission: Principal | ICD-10-CM

## 2020-05-21 LAB — CBC W/ AUTO DIFF
BASOPHILS ABSOLUTE COUNT: 0 10*9/L (ref 0.0–0.1)
BASOPHILS RELATIVE PERCENT: 0.6 %
EOSINOPHILS ABSOLUTE COUNT: 0.1 10*9/L (ref 0.0–0.5)
EOSINOPHILS RELATIVE PERCENT: 2.9 %
HEMATOCRIT: 42.6 % (ref 39.0–48.0)
HEMOGLOBIN: 14 g/dL (ref 12.9–16.5)
LYMPHOCYTES ABSOLUTE COUNT: 2.2 10*9/L (ref 1.1–3.6)
LYMPHOCYTES RELATIVE PERCENT: 55.6 %
MEAN CORPUSCULAR HEMOGLOBIN CONC: 32.8 g/dL (ref 32.0–36.0)
MEAN CORPUSCULAR HEMOGLOBIN: 27 pg (ref 25.9–32.4)
MEAN CORPUSCULAR VOLUME: 82.4 fL (ref 77.6–95.7)
MEAN PLATELET VOLUME: 7.6 fL (ref 6.8–10.7)
MONOCYTES ABSOLUTE COUNT: 0.4 10*9/L (ref 0.3–0.8)
MONOCYTES RELATIVE PERCENT: 9.1 %
NEUTROPHILS ABSOLUTE COUNT: 1.3 10*9/L — ABNORMAL LOW (ref 1.8–7.8)
NEUTROPHILS RELATIVE PERCENT: 31.8 %
PLATELET COUNT: 195 10*9/L (ref 150–450)
RED BLOOD CELL COUNT: 5.17 10*12/L (ref 4.26–5.60)
RED CELL DISTRIBUTION WIDTH: 13.9 % (ref 12.2–15.2)
WBC ADJUSTED: 3.9 10*9/L (ref 3.6–11.2)

## 2020-05-21 LAB — BASIC METABOLIC PANEL
ANION GAP: 4 mmol/L — ABNORMAL LOW (ref 5–14)
BLOOD UREA NITROGEN: 15 mg/dL (ref 9–23)
BUN / CREAT RATIO: 17
CALCIUM: 9.6 mg/dL (ref 8.7–10.4)
CHLORIDE: 107 mmol/L (ref 98–107)
CO2: 28.5 mmol/L (ref 20.0–31.0)
CREATININE: 0.87 mg/dL
EGFR CKD-EPI AA MALE: >90 mL/min/{1.73_m2} (ref >=60)
EGFR CKD-EPI NON-AA MALE: >90 mL/min/{1.73_m2} (ref >=60)
GLUCOSE RANDOM: 86 mg/dL (ref 70–179)
POTASSIUM: 4.2 mmol/L (ref 3.5–5.1)
SODIUM: 139 mmol/L (ref 135–145)

## 2020-05-21 LAB — AST: AST (SGOT): 19 U/L (ref <=34)

## 2020-05-21 LAB — ALT: ALT (SGPT): 14 U/L (ref 10–49)

## 2020-05-21 LAB — BILIRUBIN, TOTAL: BILIRUBIN TOTAL: 1.3 mg/dL — ABNORMAL HIGH (ref 0.3–1.2)

## 2020-05-21 MED ORDER — BICTEGRAVIR 50 MG-EMTRICITABINE 200 MG-TENOFOVIR ALAFENAM 25 MG TABLET
ORAL_TABLET | Freq: Every day | ORAL | 11 refills | 30 days | Status: CP
Start: 2020-05-21 — End: 2020-11-17

## 2020-05-21 MED ORDER — CITALOPRAM 40 MG TABLET
ORAL_TABLET | Freq: Every day | ORAL | 11 refills | 30 days | Status: CP
Start: 2020-05-21 — End: ?
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
## 2020-05-21 NOTE — Unmapped (Signed)
It was great to see you today.    ?? The ID clinic phone number is [phone].  ?? The ID clinic fax number is [phone].    Please note that your laboratory and other results may be visible to you in real time, possibly before they reach your provider. Please allow 48 hours for clinical interpretation of these results. Importantly, even if a result is flagged as abnormal, it may not be one that impacts your health.    For urgent issues on nights and weekends you may reach the ID Physician on call through the [facility] Operator at [phone].     URGENT CARE  Please call ahead to speak with the nursing staff if you are in need of an urgent appointment.       MEDICATIONS  For refills please contact your pharmacy and ask them to electronically send or fax the request to the clinic.   Please bring all medications in original bottles to every appointment.    [facility]MAP (formerly [employer]) or [employer] Eligibility (required even if you do not receive medication through [facility])  Please remember to renew your Juanell Fairly eligibility during renewal periods which occur twice a year: January-March and July-September.     The following are needed for each renewal:   - [facility] Identification (if you don't have one, then a bill with your name and address in [place])   - proof of income (award letter, W-2, or last three check stubs)   If you are unable to come in for renewal, let us know if we can mail, fax or e-mail paperwork to you.   HMAP Contact: [phone].     Malena Edman, MD  [facility] at [facility]  [address]   [place], [place] [address]  Phone: [phone]   Fax: [phone]     Lab info:  Your most recent CD4 T-cell counts and viral loads are below. Here are a few things to keep in mind when looking at your numbers:  ?? Our goal is to get your virus to be undetectable and keep it undetectable. If the virus is undetectable you are much more likely to stay healthy.  ?? We consider your viral load to be undetectable if it says <40 or if it says Not detected.  ?? For most people, we're checking CD4 counts every other visit (once or twice a year, or sometimes even less).  ?? It's normal for your CD4 count to be different from visit to visit.   ?? You can help by taking your medications at about the same time, every single day. If you're having trouble with taking your medications, it's important to let us know.    Lab Results   Component Value Date    ACD4 780 11/10/2019    CD4 39 11/10/2019    HIVCP <40 (H) 03/15/2020    HIVRS Detected (A) 03/15/2020

## 2020-05-22 LAB — LYMPH MARKER LIMITED,FLOW
ABSOLUTE CD3 CNT: 1672 {cells}/uL (ref 915–3400)
ABSOLUTE CD4 CNT: 946 {cells}/uL (ref 510–2320)
ABSOLUTE CD8 CNT: 682 {cells}/uL (ref 180–1520)
CD3% (T CELLS): 76 % (ref 61–86)
CD4% (T HELPER): 43 % (ref 34–58)
CD4:CD8 RATIO: 1.4 (ref 0.9–4.8)
CD8% T SUPPRESR: 31 % (ref 12–38)

## 2020-05-22 LAB — SYPHILIS SCREEN: SYPHILIS RPR SCREEN: REACTIVE — AB

## 2020-05-23 LAB — HIV RNA, QUANTITATIVE, PCR: HIV RNA QNT RSLT: NOT DETECTED

## 2020-05-23 NOTE — Unmapped (Signed)
Referral Services Note     Duration of Intervention: 15 minutes    REASON/TYPE OF CONTACT: Phone    ASSESSMENT: Pt has had worsening mental health since being off his Celexa. He got his refill and restarted and says he's feeling more stable. He says he know his mental health is bad when he gets angry quickly with those around him. His partner still lives in the home with him and has a court date in August. He states that he does not want any IPV assistance at this time. He denies wanting counseling assistance. He reports he started using cocaine as it makes him feel better. He snorts this. He also uses marijuana. He denies wanting assistance to reduce cocaine use but will call us if this changes or he has any sores in his nose.     INTERVENTION:  SW provided motivational interviewing and harm reduction education on IPV and substance use. Encourage linkage to treatment in counseling. Reminded of psychiatry appt next week.     PLAN:  Pt will attend ID psychiatry appt next week and continue celexa.      Bradly Bienenstock LCSW, CHES  [facility] ID Clinic Lead Social Worker  Direct: [phone]  Main ID: [id number]

## 2020-05-23 NOTE — Unmapped (Signed)
This patient has been disenrolled from the [facility] specialty pharmacy services due to being unable to receive packages at his home.  He will be receiving his HIV medicine at a local pharmacy.  If his situation changes, I can re-enroll him to receive his medication.Marland Kitchen    Roderic Palau  [facility] Specialty Pharmacist

## 2020-06-25 ENCOUNTER — Other Ambulatory Visit: Payer: Self-pay

## 2020-06-25 ENCOUNTER — Encounter: Payer: Self-pay | Admitting: Emergency Medicine

## 2020-06-25 ENCOUNTER — Ambulatory Visit
Admission: EM | Admit: 2020-06-25 | Discharge: 2020-06-25 | Disposition: A | Discharge status: Home / Self Care | Payer: Medicare HMO | Attending: Emergency Medicine | Admitting: Emergency Medicine

## 2020-06-25 ENCOUNTER — Emergency Department
Admission: EM | Admit: 2020-06-25 | Discharge: 2020-06-25 | Disposition: A | Discharge status: Home / Self Care | Payer: Medicare HMO | Attending: Emergency Medicine | Admitting: Emergency Medicine

## 2020-06-25 DIAGNOSIS — S61011A Laceration without foreign body of right thumb without damage to nail, initial encounter: Secondary | ICD-10-CM | POA: Insufficient documentation

## 2020-06-25 DIAGNOSIS — S61431A Puncture wound without foreign body of right hand, initial encounter: Secondary | ICD-10-CM | POA: Diagnosis not present

## 2020-06-25 DIAGNOSIS — Z5321 Procedure and treatment not carried out due to patient leaving prior to being seen by health care provider: Secondary | ICD-10-CM | POA: Insufficient documentation

## 2020-06-25 DIAGNOSIS — S61411A Laceration without foreign body of right hand, initial encounter: Secondary | ICD-10-CM

## 2020-06-25 DIAGNOSIS — W540XXA Bitten by dog, initial encounter: Secondary | ICD-10-CM | POA: Insufficient documentation

## 2020-06-25 DIAGNOSIS — B20 Human immunodeficiency virus [HIV] disease: Secondary | ICD-10-CM

## 2020-06-25 DIAGNOSIS — S61432A Puncture wound without foreign body of left hand, initial encounter: Secondary | ICD-10-CM | POA: Insufficient documentation

## 2020-06-25 MED ORDER — AMOXICILLIN-POT CLAVULANATE 875-125 MG PO TABS: 1.0000 | ORAL_TABLET | Freq: Two times a day (BID) | ORAL | 0 refills | Status: DC

## 2020-06-25 NOTE — Discharge Instructions (Signed)
Keep steri strips on as long as possible, ideally 5 days at least.  They will eventually peel off on their own.  May get wet but do not soak, if wet pat dry and allow to air dry before covering again.   Use of brace until wound has healed to keep wound edges closed.  Cleanse wound daily with soap and water until it has healed, once the strips have fallen off.  1 week of antibiotics.  Return for any  signs of infection- redness, swelling, pain, or pus drainage.

## 2020-06-25 NOTE — ED Triage Notes (Addendum)
Patient c/o being bit by his dog around 230am this morning. He reports the dog is up to date on his immunizations. Patient has laceration to right hand. Patient cleaned wounds with hydrogen peroxide. Patient received Tetanus in January 2022.

## 2020-06-25 NOTE — ED Provider Notes (Signed)
[facility]    CSN: [id number] Arrival date & time: 06/25/20  1119      History   Chief Complaint Chief Complaint  Patient presents with  . Animal Bite    HPI Spencer Klein is a 30 y.o. male.   Spencer Klein presents with complaints of lacerations related to dog bite which occurred this morning early. He cleansed the areas with hydrogen peroxide. It was his own dog, he was attempting to catch the dog and it was running away, it was provoked as he was trying to put the dogs collar on.  Superficial small lacerations/ skin tears to bilateral hands, with predominant wound to right hand at the webbing between thumb and pointer finger. No active bleeding. Full ROM of the hand. History of HIV. Last TDAp in January of 2022.     ROS per HPI, negative if not otherwise mentioned.      Past Medical History:  Diagnosis Date  . HIV (human immunodeficiency virus infection) (HCC)     There are no problems to display for this patient.   History reviewed. No pertinent surgical history.     Home Medications    Prior to Admission medications   Medication Sig Start Date End Date Taking? Authorizing Provider  amoxicillin-clavulanate (AUGMENTIN) 875-125 MG tablet Take 1 tablet by mouth every 12 (twelve) hours. 06/25/20  Yes Linlee Cromie, Dorene Grebe B, NP  bictegravir-emtricitabine-tenofovir AF (BIKTARVY) 50-200-25 MG TABS tablet Take 1 tablet by mouth daily.   Yes [provider]  citalopram (CELEXA) 20 MG tablet TAKE 1 2 (ONE HALF) TABLET BY MOUTH IN THE MORNING FOR 7 DAYS. IF TOLERATING MAY INCREASE TO 1 TABLET EACH MORNING. 11/12/18  Yes [provider]  cyclobenzaprine (FLEXERIL) 10 MG tablet TK 1 T PO BID PRN FOR [facility] 12/07/17  Yes [provider]  prazosin (MINIPRESS) 2 MG capsule  11/10/19  Yes [provider]  QUEtiapine (SEROQUEL) 50 MG tablet Take by mouth. 11/09/18  Yes [provider]    Family History History reviewed. No  pertinent family history.  Social History Social History   Tobacco Use  . Smoking status: Never Smoker  . Smokeless tobacco: Never Used  Substance Use Topics  . Alcohol use: Yes    Comment: 2 beer daily  . Drug use: Yes    Types: Marijuana     Allergies   Patient has no known allergies.   Review of Systems Review of Systems   Physical Exam Triage Vital Signs ED Triage Vitals  Enc Vitals Group     BP 06/25/20 1144 116/82     Pulse Rate 06/25/20 1144 100     Resp 06/25/20 1144 18     Temp 06/25/20 1144 98 F (36.7 C)     Temp Source 06/25/20 1144 Oral     SpO2 06/25/20 1144 100 %     Weight 06/25/20 1143 120 lb (54.4 kg)     Height 06/25/20 1143 5\' 11"  (1.803 m)     Head Circumference --      Peak Flow --      Pain Score 06/25/20 1142 2     Pain Loc --      Pain Edu? --      Excl. in GC? --    No data found.  Updated Vital Signs BP 116/82 (BP Location: Left Arm)   Pulse 100   Temp 98 F (36.7 C) (Oral)   Resp 18   Ht 5\' 11"  (1.803  m)   Wt 120 lb (54.4 kg)   SpO2 100%   BMI 16.74 kg/m   Visual Acuity Right Eye Distance:   Left Eye Distance:   Bilateral Distance:    Right Eye Near:   Left Eye Near:    Bilateral Near:     Physical Exam Constitutional:      Appearance: He is well-developed.  Cardiovascular:     Rate and Rhythm: Normal rate.  Pulmonary:     Effort: Pulmonary effort is normal.  Musculoskeletal:       Hands:     Comments: Laceration which is approximately 58mm in length to right hand thumb/ pointer webbing, which has a puncture to the superior aspect which is approximately 13mm in depth; no active bleeding; full ROM and strength intact to right thumb; wound cleansed and explored thoroughly; a few scattered superficial scratches to bilateral hands noted as well   Skin:    General: Skin is warm and dry.  Neurological:     Mental Status: He is alert and oriented to person, place, and time.      UC Treatments / Results   Labs (all labs ordered are listed, but only abnormal results are displayed) Labs Reviewed - No data to display  EKG   Radiology No results found.  Procedures Laceration Repair  Date/Time: 06/25/2020 2:25 PM Performed by: Georgetta Haber, NP Authorized by: Georgetta Haber, NP   Consent:    Consent obtained:  Verbal   Consent given by:  Patient   Risks, benefits, and alternatives were discussed: yes     Risks discussed:  Infection, need for additional repair, poor cosmetic result, retained foreign body, tendon damage, vascular damage and poor wound healing   Alternatives discussed:  Observation and referral Universal protocol:    Procedure explained and questions answered to patient or proxy's satisfaction: yes     Patient identity confirmed:  Verbally with patient Anesthesia:    Anesthesia method:  None Laceration details:    Location:  Hand   Hand location:  R hand, dorsum   Length (cm):  0.5   Depth (mm):  4 Exploration:    Imaging outcome: foreign body not noted     Wound exploration: wound explored through full range of motion and entire depth of wound visualized     Wound extent: no foreign bodies/material noted, no muscle damage noted, no tendon damage noted, no underlying fracture noted and no vascular damage noted   Treatment:    Area cleansed with:  Povidone-iodine and chlorhexidine   Amount of cleaning:  Extensive   Irrigation method:  Syringe Skin repair:    Repair method:  Steri-Strips   Number of Steri-Strips:  5 Approximation:    Approximation:  Loose Repair type:    Repair type:  Simple Post-procedure details:    Dressing:  Non-adherent dressing and splint for protection   Procedure completion:  Tolerated   (including critical care time)  Medications Ordered in UC Medications - No data to display  Initial Impression / Assessment and Plan / UC Course  I have reviewed the triage vital signs and the nursing notes.  Pertinent labs & imaging  results that were available during my care of the patient were reviewed by me and considered in my medical decision making (see chart for details).     Patient verbalizes concern that he will not be able to afford antibiotics for this dog bite wound to his hand.  Wound with pretty close approximation  with thumb splinted, so opted to use steri strips and splinting rather than a tighter close with sutures. Thorough cleansing performed before strips applied. Wound care discussed and return precautions provided. Patient verbalized understanding and agreeable to plan.   Final Clinical Impressions(s) / UC Diagnoses   Final diagnoses:  Dog bite, initial encounter  Laceration of right hand without foreign body, initial encounter     Discharge Instructions     Keep steri strips on as long as possible, ideally 5 days at least.  They will eventually peel off on their own.  May get wet but do not soak, if wet pat dry and allow to air dry before covering again.   Use of brace until wound has healed to keep wound edges closed.  Cleanse wound daily with soap and water until it has healed, once the strips have fallen off.  1 week of antibiotics.  Return for any  signs of infection- redness, swelling, pain, or pus drainage.     ED Prescriptions    Medication Sig Dispense Auth. Provider   amoxicillin-clavulanate (AUGMENTIN) 875-125 MG tablet Take 1 tablet by mouth every 12 (twelve) hours. 14 tablet Georgetta Haber, NP     PDMP not reviewed this encounter.   Georgetta Haber, NP 06/25/20 1432

## 2020-06-25 NOTE — ED Triage Notes (Signed)
Pt bit by his dog, small puncture wounds on bilateral hands with small laceration near right thumb. All bleeding controlled. Pt cleaned all wounds with peroxide. Pt reports dog UTD on all vaccines, just updated rabies vaccine. Pt denies any other injuries. Bleeding controlled at this time

## 2020-06-25 NOTE — ED Notes (Signed)
FIRST NURSE NOTE: Pt states he he does not want to stay to be seen due to wait times states he will return later on today.

## 2020-07-21 ENCOUNTER — Other Ambulatory Visit: Payer: Self-pay

## 2020-07-21 DIAGNOSIS — S025XXA Fracture of tooth (traumatic), initial encounter for closed fracture: Secondary | ICD-10-CM | POA: Insufficient documentation

## 2020-07-21 DIAGNOSIS — X58XXXA Exposure to other specified factors, initial encounter: Secondary | ICD-10-CM | POA: Diagnosis not present

## 2020-07-21 DIAGNOSIS — Z21 Asymptomatic human immunodeficiency virus [HIV] infection status: Secondary | ICD-10-CM | POA: Diagnosis not present

## 2020-07-21 DIAGNOSIS — S0993XA Unspecified injury of face, initial encounter: Secondary | ICD-10-CM | POA: Diagnosis present

## 2020-07-21 MED ORDER — LIDOCAINE VISCOUS HCL 2 % MT SOLN
15.0000 mL | Freq: Once | OROMUCOSAL | Status: AC
  Administered 2020-07-21: 15 mL via OROMUCOSAL

## 2020-07-21 NOTE — ED Triage Notes (Signed)
Pt presents to ER c/o dental pain.  Pt states his filling came out in his right lower molar. Filling noted to be missing. Pt tearful in triage.

## 2020-07-22 ENCOUNTER — Emergency Department
Admission: EM | Admit: 2020-07-22 | Discharge: 2020-07-22 | Disposition: A | Discharge status: Home / Self Care | Payer: Medicare HMO | Attending: Emergency Medicine | Admitting: Emergency Medicine

## 2020-07-22 DIAGNOSIS — K0889 Other specified disorders of teeth and supporting structures: Secondary | ICD-10-CM

## 2020-07-22 DIAGNOSIS — S025XXA Fracture of tooth (traumatic), initial encounter for closed fracture: Secondary | ICD-10-CM | POA: Diagnosis not present

## 2020-07-22 MED ORDER — HYDROCODONE-ACETAMINOPHEN 5-325 MG PO TABS: 1.0000 | ORAL_TABLET | Freq: Four times a day (QID) | ORAL | 0 refills | Status: AC | PRN

## 2020-07-22 MED ORDER — IBUPROFEN 600 MG PO TABS
600.0000 mg | ORAL_TABLET | Freq: Once | ORAL | Status: AC
Start: 2020-07-22 — End: 2020-07-22
  Administered 2020-07-22: 600 mg via ORAL
  Filled 2020-07-22: qty 1

## 2020-07-22 MED ORDER — AMOXICILLIN 500 MG PO CAPS
500.0000 mg | ORAL_CAPSULE | Freq: Once | ORAL | Status: AC
  Administered 2020-07-22: 500 mg via ORAL
  Filled 2020-07-22: qty 1

## 2020-07-22 MED ORDER — AMOXICILLIN 500 MG PO CAPS: 500.0000 mg | ORAL_CAPSULE | Freq: Three times a day (TID) | ORAL | 0 refills | Status: DC

## 2020-07-22 MED ORDER — HYDROCODONE-ACETAMINOPHEN 5-325 MG PO TABS
1.0000 | ORAL_TABLET | Freq: Once | ORAL | Status: AC
  Administered 2020-07-22: 1 via ORAL
  Filled 2020-07-22: qty 1

## 2020-07-22 MED ORDER — IBUPROFEN 600 MG PO TABS: 600.0000 mg | ORAL_TABLET | Freq: Three times a day (TID) | ORAL | 0 refills | Status: AC | PRN

## 2020-07-22 NOTE — Discharge Instructions (Signed)
1. Take antibiotic as prescribed (Amoxicillin  500mg  3 times daily x 7 days). 2. Take pain medicines as needed  (Motrin/Norco #15). 3. Return to the ER for worsening symptoms, persistent vomiting, fever, difficulty breathing or other concerns.

## 2020-07-22 NOTE — ED Provider Notes (Signed)
[facility] Emergency Department Provider Note   ____________________________________________   Event Date/Time   First MD Initiated Contact with Patient 07/22/20 0142     (approximate)  I have reviewed the triage vital signs and the nursing notes.   HISTORY  Chief Complaint Dental Pain    HPI Spencer Klein is a 30 y.o. male who presents to the ED from home with a chief complaint of dentalgia.  Patient reports feeling fell out of his right lower molar months ago.  Experiencing dental pain for the past several days.  States he initially had made an appointment for the dentist but did not have time to go.  Denies fever, facial swelling, headache, nausea/vomiting.     Past Medical History:  Diagnosis Date  . HIV (human immunodeficiency virus infection) (HCC)     There are no problems to display for this patient.   History reviewed. No pertinent surgical history.  Prior to Admission medications   Medication Sig Start Date End Date Taking? Authorizing Provider  amoxicillin (AMOXIL) 500 MG capsule Take 1 capsule (500 mg total) by mouth 3 (three) times daily. 07/22/20  Yes Irean Hong, MD  HYDROcodone-acetaminophen (NORCO) 5-325 MG tablet Take 1 tablet by mouth every 6 (six) hours as needed for moderate pain. 07/22/20  Yes Irean Hong, MD  ibuprofen (ADVIL) 600 MG tablet Take 1 tablet (600 mg total) by mouth every 8 (eight) hours as needed. 07/22/20  Yes Irean Hong, MD  amoxicillin-clavulanate (AUGMENTIN) 875-125 MG tablet Take 1 tablet by mouth every 12 (twelve) hours. 06/25/20   Georgetta Haber, NP  bictegravir-emtricitabine-tenofovir AF (BIKTARVY) 50-200-25 MG TABS tablet Take 1 tablet by mouth daily.    [provider]  citalopram (CELEXA) 20 MG tablet TAKE 1 2 (ONE HALF) TABLET BY MOUTH IN THE MORNING FOR 7 DAYS. IF TOLERATING MAY INCREASE TO 1 TABLET EACH MORNING. 11/12/18   [provider]  cyclobenzaprine (FLEXERIL) 10 MG tablet TK  1 T PO BID PRN FOR [facility] 12/07/17   [provider]  prazosin (MINIPRESS) 2 MG capsule  11/10/19   [provider]  QUEtiapine (SEROQUEL) 50 MG tablet Take by mouth. 11/09/18   [provider]    Allergies Patient has no known allergies.  History reviewed. No pertinent family history.  Social History Social History   Tobacco Use  . Smoking status: Never Smoker  . Smokeless tobacco: Never Used  Substance Use Topics  . Alcohol use: Yes    Comment: 2 beer daily  . Drug use: Yes    Types: Marijuana    Review of Systems  Constitutional: No fever/chills Eyes: No visual changes. ENT: Positive for dentalgia.  No sore throat. Cardiovascular: Denies chest pain. Respiratory: Denies shortness of breath. Gastrointestinal: No abdominal pain.  No nausea, no vomiting.  No diarrhea.  No constipation. Genitourinary: Negative for dysuria. Musculoskeletal: Negative for back pain. Skin: Negative for rash. Neurological: Negative for headaches, focal weakness or numbness.   ____________________________________________   PHYSICAL EXAM:  VITAL SIGNS: ED Triage Vitals  Enc Vitals Group     BP 07/21/20 2253 (!) 136/91     Pulse Rate 07/21/20 2253 94     Resp 07/21/20 2253 18     Temp 07/21/20 2253 98.3 F (36.8 C)     Temp Source 07/21/20 2253 Oral     SpO2 07/21/20 2253 100 %     Weight 07/21/20 2254 121 lb 4.1 oz (55 kg)  Height 07/21/20 2254 5\' 11"  (1.803 m)     Head Circumference --      Peak Flow --      Pain Score 07/21/20 2253 10     Pain Loc --      Pain Edu? --      Excl. in GC? --     Constitutional: Alert and oriented. Well appearing and in no acute distress. Strong smell of marijuana. Eyes: Conjunctivae are normal. PERRL. EOMI. Head: Atraumatic. Nose: No congestion/rhinnorhea. Mouth/Throat: Mucous membranes are moist.  Right lower posterior molar missing feeling.  Tender to palpation with tongue blade.  No intra/extraoral swelling.   Neck:  No stridor.   Cardiovascular: Normal rate, regular rhythm. Grossly normal heart sounds.  Good peripheral circulation. Respiratory: Normal respiratory effort.  No retractions. Lungs CTAB. Gastrointestinal: Soft and nontender. No distention. No abdominal bruits. No CVA tenderness. Musculoskeletal: No lower extremity tenderness nor edema.  No joint effusions. Neurologic:  Normal speech and language. No gross focal neurologic deficits are appreciated. No gait instability. Skin:  Skin is warm, dry and intact. No rash noted. Psychiatric: Mood and affect are normal. Speech and behavior are normal.  ____________________________________________   LABS (all labs ordered are listed, but only abnormal results are displayed)  Labs Reviewed - No data to display ____________________________________________  EKG  None ____________________________________________  RADIOLOGY I, Kastin Cerda J, personally viewed and evaluated these images (plain radiographs) as part of my medical decision making, as well as reviewing the written report by the radiologist.  ED MD interpretation: None  Official radiology report(s): No results found.  ____________________________________________   PROCEDURES  Procedure(s) performed (including Critical Care):  Procedures   ____________________________________________   INITIAL IMPRESSION / ASSESSMENT AND PLAN / ED COURSE  As part of my medical decision making, I reviewed the following data within the electronic MEDICAL RECORD NUMBER Nursing notes reviewed and incorporated, Old chart reviewed, Notes from prior ED visits and [place] Controlled Substance Database     30 year old male presenting with dentalgia secondary to missing filling.  Had viscous lidocaine while awaiting treatment room.  Will initiate treatment with antibiotic, NSAIDs, analgesia and encourage patient to keep his dental appointment.  Strict return precautions given.  Patient verbalizes understanding  agrees with plan of care.      ____________________________________________   FINAL CLINICAL IMPRESSION(S) / ED DIAGNOSES  Final diagnoses:  Closed fracture of tooth, initial encounter  Pain, dental     ED Discharge Orders         Ordered    amoxicillin (AMOXIL) 500 MG capsule  3 times daily        07/22/20 0154    ibuprofen (ADVIL) 600 MG tablet  Every 8 hours PRN        07/22/20 0154    HYDROcodone-acetaminophen (NORCO) 5-325 MG tablet  Every 6 hours PRN        07/22/20 0154           Note:  This document was prepared using Dragon voice recognition software and may include unintentional dictation errors.   09/21/20, MD 07/22/20 09/21/20

## 2020-07-22 NOTE — ED Notes (Signed)
Hall bed, e signature pad not working. Pt educated on discharge instructions and verbalized understanding.

## 2020-08-07 DIAGNOSIS — B20 Human immunodeficiency virus [HIV] disease: Principal | ICD-10-CM

## 2020-08-07 MED ORDER — BICTEGRAVIR 50 MG-EMTRICITABINE 200 MG-TENOFOVIR ALAFENAM 25 MG TABLET
ORAL_TABLET | Freq: Every day | ORAL | 11 refills | 30.00000 days
Start: 2020-08-07 — End: 2021-02-03

## 2020-08-12 ENCOUNTER — Other Ambulatory Visit: Payer: Self-pay

## 2020-08-12 DIAGNOSIS — M79631 Pain in right forearm: Secondary | ICD-10-CM | POA: Insufficient documentation

## 2020-08-12 DIAGNOSIS — Z21 Asymptomatic human immunodeficiency virus [HIV] infection status: Secondary | ICD-10-CM | POA: Diagnosis not present

## 2020-08-12 DIAGNOSIS — Y92481 Parking lot as the place of occurrence of the external cause: Secondary | ICD-10-CM | POA: Insufficient documentation

## 2020-08-12 DIAGNOSIS — M25521 Pain in right elbow: Secondary | ICD-10-CM | POA: Diagnosis not present
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
## 2020-08-13 ENCOUNTER — Encounter: Payer: Self-pay | Admitting: Radiology

## 2020-08-13 ENCOUNTER — Emergency Department: Payer: Medicare HMO

## 2020-08-13 ENCOUNTER — Emergency Department
Admission: EM | Admit: 2020-08-13 | Discharge: 2020-08-13 | Disposition: A | Discharge status: Home / Self Care | Payer: Medicare HMO | Attending: Emergency Medicine | Admitting: Emergency Medicine

## 2020-08-13 DIAGNOSIS — M79631 Pain in right forearm: Secondary | ICD-10-CM | POA: Diagnosis not present

## 2020-08-13 DIAGNOSIS — M79601 Pain in right arm: Secondary | ICD-10-CM
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
## 2020-08-13 NOTE — ED Notes (Signed)
NAD noted at time of D/C. Pt denies questions or concerns. Pt ambulatory to the lobby at this time.  

## 2020-09-25 ENCOUNTER — Ambulatory Visit: Admit: 2020-09-25 | Payer: MEDICARE

## 2020-09-26 DIAGNOSIS — B20 Human immunodeficiency virus [HIV] disease: Principal | ICD-10-CM

## 2020-09-26 MED ORDER — BICTEGRAVIR 50 MG-EMTRICITABINE 200 MG-TENOFOVIR ALAFENAM 25 MG TABLET
ORAL_TABLET | Freq: Every day | ORAL | 7 refills | 30.00000 days | Status: CP
Start: 2020-09-26 — End: 2021-03-25

## 2020-10-04 ENCOUNTER — Other Ambulatory Visit: Payer: Self-pay

## 2020-10-04 ENCOUNTER — Ambulatory Visit
Admission: EM | Admit: 2020-10-04 | Discharge: 2020-10-04 | Disposition: A | Discharge status: Home / Self Care | Payer: Medicare HMO | Attending: Family Medicine | Admitting: Family Medicine

## 2020-10-04 ENCOUNTER — Ambulatory Visit (INDEPENDENT_AMBULATORY_CARE_PROVIDER_SITE_OTHER): Payer: Medicare HMO

## 2020-10-04 DIAGNOSIS — K5900 Constipation, unspecified: Secondary | ICD-10-CM | POA: Diagnosis not present

## 2020-10-04 DIAGNOSIS — R1030 Lower abdominal pain, unspecified: Secondary | ICD-10-CM | POA: Diagnosis not present

## 2020-10-04 LAB — CHLAMYDIA/NGC RT PCR (ARMC ONLY)
Chlamydia Tr: NOT DETECTED
N gonorrhoeae: NOT DETECTED

## 2020-10-04 MED ORDER — MAGNESIUM CITRATE PO SOLN: 1.0000 | Freq: Once | ORAL | 0 refills | Status: AC

## 2020-10-04 NOTE — ED Triage Notes (Signed)
Pt states that he feels like he has been constipated since march. Was able to get some out august 9th but feels like there is more to come. Had anal sex last Tuesday and has felt weird since then

## 2020-10-04 NOTE — ED Provider Notes (Cosign Needed)
[facility]    CSN: [id number] Arrival date & time: 10/04/20  1521      History   Chief Complaint Chief Complaint  Patient presents with   Constipation    HPI  30 year old male presents with the above complaint.  Patient states that he has had ongoing issues since February.  He states that he is trying to "get this out of me".  He states that his bowel movements are irregular.  He states that he has no prior history of constipation.  He reports that his last bowel movement was on August 9.  He has shown me a picture of it and it looks to be like a loose stool.  He reports lower abdominal pain.  He states that he tries to have a bowel movement regularly and continues to have lower abdominal pain particular on the right side.  He has tried MiraLAX, [employer] and lemon and water, and stool softeners without resolution.    Past Medical History:  Diagnosis Date   HIV (human immunodeficiency virus infection) (HCC)     Home Medications    Prior to Admission medications   Medication Sig Start Date End Date Taking? Authorizing Provider  bictegravir-emtricitabine-tenofovir AF (BIKTARVY) 50-200-25 MG TABS tablet Take 1 tablet by mouth daily.   Yes [provider]  citalopram (CELEXA) 20 MG tablet TAKE 1 2 (ONE HALF) TABLET BY MOUTH IN THE MORNING FOR 7 DAYS. IF TOLERATING MAY INCREASE TO 1 TABLET EACH MORNING. 11/12/18  Yes [provider]  cyclobenzaprine (FLEXERIL) 10 MG tablet TK 1 T PO BID PRN FOR [facility] 12/07/17  Yes [provider]  magnesium citrate SOLN Take 296 mLs (1 Bottle total) by mouth once for 1 dose. 10/04/20 10/04/20 Yes Latha Staunton, Verdis Frederickson, DO  prazosin (MINIPRESS) 2 MG capsule  11/10/19  Yes [provider]  QUEtiapine (SEROQUEL) 50 MG tablet Take by mouth. 11/09/18  Yes [provider]  HYDROcodone-acetaminophen (NORCO) 5-325 MG tablet Take 1 tablet by mouth every 6 (six) hours as needed for moderate pain. 07/22/20   Irean Hong, MD  ibuprofen (ADVIL) 600 MG tablet Take 1 tablet (600 mg total) by mouth every 8 (eight) hours as needed. 07/22/20   Irean Hong, MD    Family History History reviewed. No pertinent family history.  Social History Social History   Tobacco Use   Smoking status: Never   Smokeless tobacco: Never  Substance Use Topics   Alcohol use: Yes    Comment: 2 beer daily   Drug use: Yes    Types: Marijuana, Cocaine     Allergies   Patient has no known allergies.   Review of Systems Review of Systems Per HPI  Physical Exam Triage Vital Signs ED Triage Vitals [10/04/20 1609]  Enc Vitals Group     BP 119/83     Pulse Rate 78     Resp 16     Temp 98.8 F (37.1 C)     Temp Source Oral     SpO2 100 %     Weight 125 lb (56.7 kg)     Height 5\' 11"  (1.803 m)     Head Circumference      Peak Flow      Pain Score 0     Pain Loc      Pain Edu?      Excl. in GC?    Updated Vital Signs BP 119/83 (BP Location: Left Arm)   Pulse 78  Temp 98.8 F (37.1 C) (Oral)   Resp 16   Ht 5\' 11"  (1.803 m)   Wt 56.7 kg   SpO2 100%   BMI 17.43 kg/m   Visual Acuity Right Eye Distance:   Left Eye Distance:   Bilateral Distance:    Right Eye Near:   Left Eye Near:    Bilateral Near:     Physical Exam Constitutional:      General: He is not in acute distress.    Appearance: He is not ill-appearing.  HENT:     Head: Normocephalic and atraumatic.  Cardiovascular:     Rate and Rhythm: Normal rate and regular rhythm.  Pulmonary:     Effort: Pulmonary effort is normal.     Breath sounds: Normal breath sounds.  Abdominal:     General: There is no distension.     Palpations: Abdomen is soft.     Comments: Tenderness to palpation in the left lower quadrant and right lower quadrant.  Neurological:     Mental Status: He is alert.     UC Treatments / Results  Labs (all labs ordered are listed, but only abnormal results are displayed) Labs Reviewed  CHLAMYDIA/NGC RT PCR [facility]  ONLY)              EKG   Radiology DG Abd 1 View  Result Date: 10/04/2020 CLINICAL DATA:  Constipated EXAM: ABDOMEN - 1 VIEW COMPARISON:  None. FINDINGS: The bowel gas pattern is normal. No radio-opaque calculi or other significant radiographic abnormality are seen. Mild stool in the colon. Phleboliths in the pelvis. IMPRESSION: Negative.  Mild stool burden Electronically Signed   By: 10/06/2020 M.D.   On: 10/04/2020 17:10    Procedures Procedures (including critical care time)  Medications Ordered in UC Medications - No data to display  Initial Impression / Assessment and Plan / UC Course  I have reviewed the triage vital signs and the nursing notes.  Pertinent labs & imaging results that were available during my care of the patient were reviewed by me and considered in my medical decision making (see chart for details).    30 year old male presents with constipation.  Associated lower abdominal pain.  KUB was obtained and was independent reviewed by me.  Interpretation: Mild stool burden.  Otherwise unremarkable.  Treating with mag citrate.  Advised continued use of MiraLAX.  Referral placed to GI given the chronic nature of his issue.   Final Clinical Impressions(s) / UC Diagnoses   Final diagnoses:  Constipation  Lower abdominal pain     Discharge Instructions      Medication as prescribed.  Continue Miralax (17 g twice daily as needed for constipation).  I am referring you to GI given the chronic nature of your issue.  Take care  Dr. 38    ED Prescriptions     Medication Sig Dispense Auth. Provider   magnesium citrate SOLN Take 296 mLs (1 Bottle total) by mouth once for 1 dose. 296 mL Adriana Simas, DO      PDMP not reviewed this encounter.   Tommie Sams, Tommie Sams 10/04/20 1926

## 2020-10-04 NOTE — Discharge Instructions (Signed)
Medication as prescribed.  Continue Miralax (17 g twice daily as needed for constipation).  I am referring you to GI given the chronic nature of your issue.  Take care  Dr. Adriana Simas

## 2020-11-05 ENCOUNTER — Ambulatory Visit: Admit: 2020-11-05 | Payer: MEDICARE | Attending: Infectious Disease | Primary: Infectious Disease

## 2020-12-07 ENCOUNTER — Encounter: Payer: Self-pay | Admitting: Gastroenterology

## 2020-12-07 ENCOUNTER — Other Ambulatory Visit: Payer: Self-pay

## 2020-12-07 ENCOUNTER — Ambulatory Visit: Payer: Medicare HMO | Admitting: Gastroenterology

## 2020-12-07 ENCOUNTER — Ambulatory Visit (INDEPENDENT_AMBULATORY_CARE_PROVIDER_SITE_OTHER): Payer: Medicare HMO | Admitting: Gastroenterology

## 2020-12-07 VITALS — BP 112/70 | HR 85 | Temp 97.9°F | Ht 69.0 in | Wt 127.0 lb

## 2020-12-07 DIAGNOSIS — Z1211 Encounter for screening for malignant neoplasm of colon: Secondary | ICD-10-CM

## 2020-12-07 DIAGNOSIS — K5909 Other constipation: Secondary | ICD-10-CM | POA: Diagnosis not present

## 2020-12-07 DIAGNOSIS — D649 Anemia, unspecified: Secondary | ICD-10-CM | POA: Diagnosis not present

## 2020-12-07 MED ORDER — GOLYTELY 236 G PO SOLR: 8000.0000 mL | Freq: Once | ORAL | 0 refills | Status: AC

## 2020-12-07 MED ORDER — PEG 3350-KCL-NA BICARB-NACL 420 G PO SOLR: 4000.0000 mL | Freq: Once | ORAL | 0 refills | Status: AC
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
## 2020-12-07 NOTE — Patient Instructions (Signed)
High-Fiber Eating Plan °Fiber, also called dietary fiber, is a type of carbohydrate. It is found foods such as fruits, vegetables, whole grains, and beans. A high-fiber diet can have many health benefits. Your health care provider may recommend a high-fiber diet to help: °Prevent constipation. Fiber can make your bowel movements more regular. °Lower your cholesterol. °Relieve the following conditions: °Inflammation of veins in the anus (hemorrhoids). °Inflammation of specific areas of the digestive tract (uncomplicated diverticulosis). °A problem of the large intestine, also called the colon, that sometimes causes pain and diarrhea (irritable bowel syndrome, or IBS). °Prevent overeating as part of a weight-loss plan. °Prevent heart disease, type 2 diabetes, and certain cancers. °What are tips for following this plan? °Reading food labels ° °Check the nutrition facts label on food products for the amount of dietary fiber. Choose foods that have 5 grams of fiber or more per serving. °The goals for recommended daily fiber intake include: °Men (age 50 or younger): 34-38 g. °Men (over age 50): 28-34 g. °Women (age 50 or younger): 25-28 g. °Women (over age 50): 22-25 g. °Your daily fiber goal is _____________ g. °Shopping °Choose whole fruits and vegetables instead of processed forms, such as apple juice or applesauce. °Choose a wide variety of high-fiber foods such as avocados, lentils, oats, and kidney beans. °Read the nutrition facts label of the foods you choose. Be aware of foods with added fiber. These foods often have high sugar and sodium amounts per serving. °Cooking °Use whole-grain flour for baking and cooking. °Cook with brown rice instead of white rice. °Meal planning °Start the day with a breakfast that is high in fiber, such as a cereal that contains 5 g of fiber or more per serving. °Eat breads and cereals that are made with whole-grain flour instead of refined flour or white flour. °Eat brown rice, bulgur  wheat, or millet instead of white rice. °Use beans in place of meat in soups, salads, and pasta dishes. °Be sure that half of the grains you eat each day are whole grains. °General information °You can get the recommended daily intake of dietary fiber by: °Eating a variety of fruits, vegetables, grains, nuts, and beans. °Taking a fiber supplement if you are not able to take in enough fiber in your diet. It is better to get fiber through food than from a supplement. °Gradually increase how much fiber you consume. If you increase your intake of dietary fiber too quickly, you may have bloating, cramping, or gas. °Drink plenty of water to help you digest fiber. °Choose high-fiber snacks, such as berries, raw vegetables, nuts, and popcorn. °What foods should I eat? °Fruits °Berries. Pears. Apples. Oranges. Avocado. Prunes and raisins. Dried figs. °Vegetables °Sweet potatoes. Spinach. Kale. Artichokes. Cabbage. Broccoli. Cauliflower. Green peas. Carrots. Squash. °Grains °Whole-grain breads. Multigrain cereal. Oats and oatmeal. Brown rice. Barley. Bulgur wheat. Millet. Quinoa. Bran muffins. Popcorn. Rye wafer crackers. °Meats and other proteins °Navy beans, kidney beans, and pinto beans. Soybeans. Split peas. Lentils. Nuts and seeds. °Dairy °Fiber-fortified yogurt. °Beverages °Fiber-fortified soy milk. Fiber-fortified orange juice. °Other foods °Fiber bars. °The items listed above may not be a complete list of recommended foods and beverages. Contact a dietitian for more information. °What foods should I avoid? °Fruits °Fruit juice. Cooked, strained fruit. °Vegetables °Fried potatoes. Canned vegetables. Well-cooked vegetables. °Grains °White bread. Pasta made with refined flour. White rice. °Meats and other proteins °Fatty cuts of meat. Fried chicken or fried fish. °Dairy °Milk. Yogurt. Cream cheese. Sour cream. °Fats and   oils °Butters. °Beverages °Soft drinks. °Other foods °Cakes and pastries. °The items listed above may  not be a complete list of foods and beverages to avoid. Talk with your dietitian about what choices are best for you. °Summary °Fiber is a type of carbohydrate. It is found in foods such as fruits, vegetables, whole grains, and beans. °A high-fiber diet has many benefits. It can help to prevent constipation, lower blood cholesterol, aid weight loss, and reduce your risk of heart disease, diabetes, and certain cancers. °Increase your intake of fiber gradually. Increasing fiber too quickly may cause cramping, bloating, and gas. Drink plenty of water while you increase the amount of fiber you consume. °The best sources of fiber include whole fruits and vegetables, whole grains, nuts, seeds, and beans. °This information is not intended to replace advice given to you by your health care provider. Make sure you discuss any questions you have with your health care provider. °Document Revised: 06/09/2019 Document Reviewed: 06/09/2019 °Elsevier Patient Education © 2022 [employer] ° °

## 2020-12-08 LAB — COMPREHENSIVE METABOLIC PANEL
ALT: 12 IU/L (ref 0–44)
AST: 18 IU/L (ref 0–40)
Albumin/Globulin Ratio: 1.8 (ref 1.2–2.2)
Albumin: 4.4 g/dL (ref 4.0–5.0)
Alkaline Phosphatase: 73 IU/L (ref 44–121)
BUN/Creatinine Ratio: 14 (ref 9–20)
BUN: 12 mg/dL (ref 6–20)
Bilirubin Total: 0.4 mg/dL (ref 0.0–1.2)
CO2: 27 mmol/L (ref 20–29)
Calcium: 9.4 mg/dL (ref 8.7–10.2)
Chloride: 101 mmol/L (ref 96–106)
Creatinine, Ser: 0.83 mg/dL (ref 0.76–1.27)
Globulin, Total: 2.5 g/dL (ref 1.5–4.5)
Glucose: 78 mg/dL (ref 70–99)
Potassium: 5 mmol/L (ref 3.5–5.2)
Sodium: 140 mmol/L (ref 134–144)
Total Protein: 6.9 g/dL (ref 6.0–8.5)
eGFR: 120 mL/min/{1.73_m2} (ref >59)

## 2020-12-08 LAB — CBC
Hematocrit: 43.7 % (ref 37.5–51.0)
Hemoglobin: 14.4 g/dL (ref 13.0–17.7)
MCH: 26.8 pg (ref 26.6–33.0)
MCHC: 33 g/dL (ref 31.5–35.7)
MCV: 81 fL (ref 79–97)
Platelets: 241 10*3/uL (ref 150–450)
RBC: 5.38 x10E6/uL (ref 4.14–5.80)
RDW: 12.4 % (ref 11.6–15.4)
WBC: 4.8 10*3/uL (ref 3.4–10.8)

## 2020-12-08 LAB — B12 AND FOLATE PANEL
Folate: 11.9 ng/mL (ref >3.0)
Vitamin B-12: 323 pg/mL (ref 232–1245)

## 2020-12-08 LAB — IRON,TIBC AND FERRITIN PANEL
Ferritin: 62 ng/mL (ref 30–400)
Iron Saturation: 26 % (ref 15–55)
Iron: 68 ug/dL (ref 38–169)
Total Iron Binding Capacity: 265 ug/dL (ref 250–450)
UIBC: 197 ug/dL (ref 111–343)

## 2020-12-08 LAB — TSH: TSH: 1.19 u[IU]/mL (ref 0.450–4.500)

## 2020-12-10 ENCOUNTER — Ambulatory Visit: Admit: 2020-12-10 | Payer: MEDICARE | Attending: Infectious Disease | Primary: Infectious Disease

## 2020-12-25 ENCOUNTER — Ambulatory Visit: Admission: RE | Admit: 2020-12-25 | Payer: Medicare HMO | Source: Home / Self Care | Admitting: Gastroenterology

## 2020-12-25 ENCOUNTER — Encounter: Admission: RE | Payer: Self-pay | Source: Home / Self Care

## 2020-12-25 SURGERY — COLONOSCOPY WITH PROPOFOL
Anesthesia: General

## 2021-02-03 ENCOUNTER — Ambulatory Visit
Admit: 2021-02-03 | Discharge: 2021-02-03 | Disposition: A | Discharge status: Home / Self Care | Payer: MEDICARE | Attending: Emergency Medicine

## 2021-02-03 ENCOUNTER — Emergency Department
Admit: 2021-02-03 | Discharge: 2021-02-03 | Disposition: A | Discharge status: Home / Self Care | Payer: MEDICARE | Attending: Emergency Medicine

## 2021-02-03 DIAGNOSIS — R6883 Chills (without fever): Principal | ICD-10-CM

## 2021-02-03 DIAGNOSIS — B20 Human immunodeficiency virus [HIV] disease: Principal | ICD-10-CM

## 2021-02-03 DIAGNOSIS — U071 COVID: Principal | ICD-10-CM

## 2021-02-03 DIAGNOSIS — R112 Nausea with vomiting, unspecified: Principal | ICD-10-CM

## 2021-02-03 DIAGNOSIS — R52 Pain, unspecified: Principal | ICD-10-CM

## 2021-02-03 MED ORDER — PAXLOVID 300 MG (150 MG X 2)-100 MG TABLETS IN A DOSE PACK (EUA)
ORAL_TABLET | 0 refills | 0 days | Status: CP
Start: 2021-02-03 — End: 2021-02-03

## 2021-02-03 MED ORDER — ONDANSETRON 4 MG DISINTEGRATING TABLET
ORAL_TABLET | Freq: Three times a day (TID) | ORAL | 0 refills | 5 days | Status: CP | PRN
Start: 2021-02-03 — End: 2021-02-10

## 2021-02-04 DIAGNOSIS — U071 COVID: Principal | ICD-10-CM

## 2021-02-04 MED ORDER — PAXLOVID 300 MG (150 MG X 2)-100 MG TABLETS IN A DOSE PACK (EUA)
ORAL_TABLET | 0 refills | 0 days | Status: CP
Start: 2021-02-04 — End: ?
  Filled 2021-02-05: qty 30, 5d supply, fill #0

## 2021-04-01 ENCOUNTER — Ambulatory Visit: Admit: 2021-04-01 | Payer: MEDICARE | Attending: Infectious Disease | Primary: Infectious Disease

## 2021-04-04 ENCOUNTER — Ambulatory Visit: Payer: Medicare HMO | Admitting: Gastroenterology

## 2021-04-04 ENCOUNTER — Other Ambulatory Visit: Payer: Self-pay

## 2021-04-07 ENCOUNTER — Ambulatory Visit: Admit: 2021-04-07 | Discharge: 2021-04-08 | Disposition: A | Discharge status: Home / Self Care | Payer: MEDICARE

## 2021-04-08 MED ORDER — ONDANSETRON 4 MG DISINTEGRATING TABLET
ORAL_TABLET | Freq: Three times a day (TID) | ORAL | 0 refills | 3 days | Status: CP | PRN
Start: 2021-04-08 — End: 2021-04-08

## 2021-04-15 ENCOUNTER — Ambulatory Visit: Admit: 2021-04-15 | Payer: MEDICARE | Attending: Infectious Disease | Primary: Infectious Disease

## 2021-04-19 DIAGNOSIS — F4312 Post-traumatic stress disorder, chronic: Principal | ICD-10-CM

## 2021-04-19 MED ORDER — CITALOPRAM 40 MG TABLET
ORAL_TABLET | Freq: Every day | ORAL | 11 refills | 30 days
Start: 2021-04-19 — End: ?

## 2021-04-21 MED ORDER — CITALOPRAM 40 MG TABLET
ORAL_TABLET | Freq: Every day | ORAL | 11 refills | 30 days | Status: CP
Start: 2021-04-21 — End: ?
  Filled 2021-07-02: qty 30, 30d supply, fill #0

## 2021-07-12 ENCOUNTER — Ambulatory Visit: Admit: 2021-07-12 | Discharge: 2021-07-13 | Disposition: A | Discharge status: Home / Self Care | Payer: MEDICARE

## 2021-07-12 DIAGNOSIS — B20 Human immunodeficiency virus [HIV] disease: Principal | ICD-10-CM

## 2021-07-12 DIAGNOSIS — Z202 Contact with and (suspected) exposure to infections with a predominantly sexual mode of transmission: Principal | ICD-10-CM

## 2021-07-12 MED ORDER — DOXYCYCLINE HYCLATE 100 MG CAPSULE
ORAL_CAPSULE | Freq: Two times a day (BID) | ORAL | 0 refills | 7 days | Status: CP
Start: 2021-07-12 — End: 2021-07-19

## 2021-07-12 MED ORDER — BICTEGRAVIR 50 MG-EMTRICITABINE 200 MG-TENOFOVIR ALAFENAM 25 MG TABLET
ORAL_TABLET | Freq: Every day | ORAL | 0 refills | 30 days | Status: CP
Start: 2021-07-12 — End: 2021-08-11

## 2021-08-05 ENCOUNTER — Ambulatory Visit
Admit: 2021-08-05 | Discharge: 2021-08-06 | Payer: MEDICARE | Attending: Infectious Disease | Primary: Infectious Disease

## 2021-08-05 DIAGNOSIS — F4312 Post-traumatic stress disorder, chronic: Principal | ICD-10-CM

## 2021-08-05 DIAGNOSIS — Z7289 Other problems related to lifestyle: Principal | ICD-10-CM

## 2021-08-05 DIAGNOSIS — Z23 Encounter for immunization: Principal | ICD-10-CM

## 2021-08-05 DIAGNOSIS — Z113 Encounter for screening for infections with a predominantly sexual mode of transmission: Principal | ICD-10-CM

## 2021-08-05 DIAGNOSIS — B20 Human immunodeficiency virus [HIV] disease: Principal | ICD-10-CM

## 2021-08-05 LAB — CBC W/ AUTO DIFF
BASOPHILS ABSOLUTE COUNT: 0 10*9/L (ref 0.0–0.1)
BASOPHILS RELATIVE PERCENT: 0.7 %
EOSINOPHILS ABSOLUTE COUNT: 0.1 10*9/L (ref 0.0–0.5)
EOSINOPHILS RELATIVE PERCENT: 2 %
HEMATOCRIT: 42.6 % (ref 39.0–48.0)
HEMOGLOBIN: 13.7 g/dL (ref 12.9–16.5)
LYMPHOCYTES ABSOLUTE COUNT: 3 10*9/L (ref 1.1–3.6)
LYMPHOCYTES RELATIVE PERCENT: 55.2 %
MEAN CORPUSCULAR HEMOGLOBIN CONC: 32.2 g/dL (ref 32.0–36.0)
MEAN CORPUSCULAR HEMOGLOBIN: 26.3 pg (ref 25.9–32.4)
MEAN CORPUSCULAR VOLUME: 81.9 fL (ref 77.6–95.7)
MEAN PLATELET VOLUME: 7.8 fL (ref 6.8–10.7)
MONOCYTES ABSOLUTE COUNT: 0.5 10*9/L (ref 0.3–0.8)
MONOCYTES RELATIVE PERCENT: 9.3 %
NEUTROPHILS ABSOLUTE COUNT: 1.8 10*9/L (ref 1.8–7.8)
NEUTROPHILS RELATIVE PERCENT: 32.8 %
NUCLEATED RED BLOOD CELLS: 0 /100{WBCs} (ref <=4)
PLATELET COUNT: 229 10*9/L (ref 150–450)
RED BLOOD CELL COUNT: 5.2 10*12/L (ref 4.26–5.60)
RED CELL DISTRIBUTION WIDTH: 14.6 % (ref 12.2–15.2)
WBC ADJUSTED: 5.4 10*9/L (ref 3.6–11.2)

## 2021-08-05 LAB — LYMPH MARKER LIMITED,FLOW
ABSOLUTE CD3 CNT: 2220 {cells}/uL (ref 915–3400)
ABSOLUTE CD4 CNT: 1110 {cells}/uL (ref 510–2320)
ABSOLUTE CD8 CNT: 1050 {cells}/uL (ref 180–1520)
CD3% (T CELLS): 74 % (ref 61–86)
CD4% (T HELPER): 37 % (ref 34–58)
CD4:CD8 RATIO: 1.1 (ref 0.9–4.8)
CD8% T SUPPRESR: 35 % (ref 12–38)

## 2021-08-05 LAB — HIV RNA, QUANTITATIVE, PCR: HIV RNA QNT RSLT: NOT DETECTED

## 2021-08-05 MED ORDER — CITALOPRAM 40 MG TABLET
ORAL_TABLET | Freq: Every day | ORAL | 11 refills | 30 days | Status: CP
Start: 2021-08-05 — End: ?
  Filled 2021-08-07: qty 30, 30d supply, fill #0

## 2021-08-05 MED ORDER — BICTEGRAVIR 50 MG-EMTRICITABINE 200 MG-TENOFOVIR ALAFENAM 25 MG TABLET
ORAL_TABLET | Freq: Every day | ORAL | 0 refills | 30 days | Status: CP
Start: 2021-08-05 — End: 2021-09-04
  Filled 2021-08-07: qty 30, 30d supply, fill #0

## 2021-08-05 NOTE — Unmapped (Signed)
It was great to see you today.    The ID clinic phone number is [phone].  The ID clinic fax number is [phone].    Please note that your laboratory and other results may be visible to you in real time, possibly before they reach your provider. Please allow 48 hours for clinical interpretation of these results. Importantly, even if a result is flagged as abnormal, it may not be one that impacts your health.    For urgent issues on nights and weekends you may reach the ID Physician on call through the [facility] Operator at [phone].     URGENT CARE  Please call ahead to speak with the nursing staff if you are in need of an urgent appointment.       MEDICATIONS  For refills please contact your pharmacy and ask them to electronically send or fax the request to the clinic.   Please bring all medications in original bottles to every appointment.    HMAP (formerly ADAP) or [employer] Eligibility (required even if you do not receive medication through [facility])  Please remember to renew your Juanell Fairly eligibility during renewal periods which occur twice a year: January-March and July-September.     The following are needed for each renewal:   - [facility] Identification (if you don't have one, then a bill with your name and address in [place])   - proof of income (award letter, W-2, or last three check stubs)   If you are unable to come in for renewal, let us know if we can mail, fax or e-mail paperwork to you.   HMAP Contact: [phone].     Malena Edman, MD  [facility]  [address]   [place], [place] [address]  Phone: [phone]   Fax: [phone]     Lab info:  Your most recent CD4 T-cell counts and viral loads are below. Here are a few things to keep in mind when looking at your numbers:  Our goal is to get your virus to be undetectable and keep it undetectable. If the virus is undetectable you are much more likely to stay healthy.  We consider your viral load to be undetectable if it says <40 or if it says Not detected.  For most people, we're checking CD4 counts every other visit (once or twice a year, or sometimes even less).  It's normal for your CD4 count to be different from visit to visit.   You can help by taking your medications at about the same time, every single day. If you're having trouble with taking your medications, it's important to let us know.    Lab Results   Component Value Date    ACD4 232 (L) 02/03/2021    CD4 29 (L) 02/03/2021    HIVCP <40 (H) 03/15/2020    HIVRS Not Detected 05/21/2020
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
## 2021-08-05 NOTE — Unmapped (Signed)
[facility] Specialty Medication Onboarding    Specialty Medication: BIKTARVY 50-200-25 mg tablet (bictegrav-emtricit-tenofov ala)  Prior Authorization: Not Required   Financial Assistance: No - copay  <$25  Final Copay/Day Supply: $0 / 30    Insurance Restrictions: None     Notes to Pharmacist:     The triage team has completed the benefits investigation and has determined that the patient is able to fill this medication at [facility]. Please contact the patient to complete the onboarding or follow up with the prescribing physician as needed.

## 2021-08-05 NOTE — Unmapped (Signed)
Name: Roverto Bodmer  Date: 08/05/2021  Address: [address]  [address]  [place] [address]   [place] of Residence:  [place]  Phone: [phone]     Started assessment with patient options: in clinic     Is this the same address for mailing? No  If No, Mailing Address is:   [address] [place] [address]  Housing Status  Stable/Permanent    Insurance  Medicaid and Medicare Part C    Tax Filing Status  Head of Household    Employment Status  Employed Part Time     Income  Salary/Wages and Social Security (Retirement/Survivor's/Disability)    If no or low income, how are you meeting your basic needs?  Not Applicable    List Tax Household Members including relationship to you:   Hurman Horn- niece  Soyla Dryer- sister  Carlos Levering- niece  Royalty Martin-niece    Someone in my household receives: Not Applicable (for household members)  Specify who: N/A    Do you have a current diagnosis for Hepatitis C?  Lab Results   Component Value Date    HEPCAB Nonreactive 10/31/2019       Have you used tobacco products four or more times per week in the last six months? Yes      Teacher, adult education  Patient has affordable insurance through [employer], [place], and or Employment and is not eligible.    Patient given ACA education if they qualified based on answers to questions above.     MyChart  Do you have an active MyChart account? N/A     If MyChart is not set up, informed patient on how to set up MyChart N/A    Patient was informed of the following programs;   N/A    The following applications/handouts were given to patient:   N/A    The following forms were also started with the patient:   N/A    Juanell Fairly application status: Incomplete; patient needs to send ssi letter    Patient is applying for [employer] on Charges Only     Additional Comments: No issues accessing meds. Recently started working and havent received a paycheck yet. Makes $14/hr working 28 hours weekly. Will bring SSI letter to the clinic. And will bring 2 paystubs when he receives them.      Rhetta Mura  ID Clinic Benefits Counselor  Time of Intervention-6mins

## 2021-08-05 NOTE — Unmapped (Signed)
RN administered Moderna Vaccine 0.5 mL left deltoid. Pt tolerated well.

## 2021-08-05 NOTE — Unmapped (Signed)
PROMIS Tablet Screening  Completed Date: 08/05/2021     SW reviewed self-administered screening.    Patient had a PHQ-9 score of 4  indicating None-Minimal depression.   Pt denies SI.   Pt scored 8 on AUDIT/AUDIT-C indicating At-risk drinking   Pt  endorses Marijuana substance use in past 3 months.  Pt endorses concerns for IPV.    Pt seen by provider for regular ID visit.  Pt was seen in person by Social Work during this visit. Pt's provider also discussed concerns with the pt and provided resources from previous visit. Pt has been connected with [facility] program in the past.     Roger Bowers, Roger Bowers  [facility] Clinic Social Work

## 2021-08-06 LAB — SYPHILIS SCREEN: RPR TITER REFLEX: 1:4 {titer} — AB
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
## 2021-08-08 LAB — HEPATITIS C ANTIBODY: HEPATITIS C ANTIBODY: NONREACTIVE

## 2021-08-30 DIAGNOSIS — B20 Human immunodeficiency virus [HIV] disease: Principal | ICD-10-CM

## 2021-08-30 MED ORDER — BIKTARVY 50 MG-200 MG-25 MG TABLET
ORAL_TABLET | Freq: Every day | ORAL | 0 refills | 30 days | Status: CP
Start: 2021-08-30 — End: 2021-09-29
  Filled 2021-10-03: qty 30, 30d supply, fill #0

## 2021-08-30 NOTE — Unmapped (Signed)
Medication Requested: Susanne Borders      Last Office Visit: 08/05/2021      Next Office Visit: 10/07/2021    Per Provider Note: Your last note was blocked , Previously out of care  Standing order protocol requirements met?: No    Sent to: Provider for signing    Days Supply Given: 0  Number of Refills: 0

## 2021-09-04 NOTE — Unmapped (Signed)
Spoke with patient. He reports not needing any Biktarvy at this time. Pushing call out 2 weeks. I asked patient to call us back to schedule his delivery. No questions for the pharmacist.

## 2021-10-01 NOTE — Unmapped (Signed)
[facility] Refill Coordination Note    Specialty Medication(s) to be Shipped:   Infectious Disease: Biktarvy    Other medication(s) to be shipped: citalopram     Roger Bowers, DOB: 08/31/1990  Phone: There are no phone numbers on file.      All above HIPAA information was verified with patient.     Was a Nurse, learning disability used for this call? No    Completed refill call assessment today to schedule patient's medication shipment from the [facility] [phone]).  All relevant notes have been reviewed.     Specialty medication(s) and dose(s) confirmed: Regimen is correct and unchanged.   Changes to medications: Roger Bowers reports no changes at this time.  Changes to insurance: No  New side effects reported not previously addressed with a pharmacist or physician: None reported  Questions for the pharmacist: No    Confirmed patient received a Conservation officer, historic buildings and a Surveyor, mining with first shipment. The patient will receive a drug information handout for each medication shipped and additional FDA Medication Guides as required.       DISEASE/MEDICATION-SPECIFIC INFORMATION        N/A    SPECIALTY MEDICATION ADHERENCE     Medication Adherence    Patient reported X missed doses in the last month: 0  Specialty Medication: biktarvy                          Were doses missed due to medication being on hold? No    biktarvy 50-200-25mg   : unable to confirm quantity on hand    REFERRAL TO PHARMACIST     Referral to the pharmacist: Not needed      [facility]     Shipping address confirmed in Epic.     Delivery Scheduled: Yes, Expected medication delivery date: 8/18.     Medication will be delivered via Next Day Courier to the prescription address in Epic WAM.    Roger Bowers   [facility] Specialty Technician

## 2021-10-02 ENCOUNTER — Emergency Department: Admit: 2021-10-02 | Discharge: 2021-10-02 | Disposition: A | Discharge status: Home / Self Care | Payer: MEDICARE

## 2021-10-02 ENCOUNTER — Ambulatory Visit: Admit: 2021-10-02 | Discharge: 2021-10-02 | Disposition: A | Discharge status: Home / Self Care | Payer: MEDICARE

## 2021-10-02 DIAGNOSIS — B20 Human immunodeficiency virus [HIV] disease: Principal | ICD-10-CM

## 2021-10-02 DIAGNOSIS — B348 Other viral infections of unspecified site: Principal | ICD-10-CM

## 2021-10-02 LAB — COMPREHENSIVE METABOLIC PANEL
ALBUMIN: 4 g/dL (ref 3.4–5.0)
ALKALINE PHOSPHATASE: 68 U/L (ref 46–116)
ALT (SGPT): 15 U/L (ref 10–49)
ANION GAP: 7 mmol/L (ref 5–14)
AST (SGOT): 24 U/L (ref <=34)
BILIRUBIN TOTAL: 0.8 mg/dL (ref 0.3–1.2)
BLOOD UREA NITROGEN: 5 mg/dL — ABNORMAL LOW (ref 9–23)
BUN / CREAT RATIO: 5
CALCIUM: 8.8 mg/dL (ref 8.7–10.4)
CHLORIDE: 105 mmol/L (ref 98–107)
CO2: 26 mmol/L (ref 20.0–31.0)
CREATININE: 1.07 mg/dL
EGFR CKD-EPI (2021) MALE: >90 mL/min/{1.73_m2} (ref >=60)
GLUCOSE RANDOM: 95 mg/dL (ref 70–179)
POTASSIUM: 3.6 mmol/L (ref 3.4–4.8)
PROTEIN TOTAL: 7.4 g/dL (ref 5.7–8.2)
SODIUM: 138 mmol/L (ref 135–145)

## 2021-10-02 LAB — CBC W/ AUTO DIFF
BASOPHILS ABSOLUTE COUNT: 0 10*9/L (ref 0.0–0.1)
BASOPHILS RELATIVE PERCENT: 0.8 %
EOSINOPHILS ABSOLUTE COUNT: 0 10*9/L (ref 0.0–0.5)
EOSINOPHILS RELATIVE PERCENT: 0.7 %
HEMATOCRIT: 37.6 % — ABNORMAL LOW (ref 39.0–48.0)
HEMOGLOBIN: 12.7 g/dL — ABNORMAL LOW (ref 12.9–16.5)
LYMPHOCYTES ABSOLUTE COUNT: 1.1 10*9/L (ref 1.1–3.6)
LYMPHOCYTES RELATIVE PERCENT: 25.5 %
MEAN CORPUSCULAR HEMOGLOBIN CONC: 33.8 g/dL (ref 32.0–36.0)
MEAN CORPUSCULAR HEMOGLOBIN: 28.5 pg (ref 25.9–32.4)
MEAN CORPUSCULAR VOLUME: 84.4 fL (ref 77.6–95.7)
MEAN PLATELET VOLUME: 7.4 fL (ref 6.8–10.7)
MONOCYTES ABSOLUTE COUNT: 0.5 10*9/L (ref 0.3–0.8)
MONOCYTES RELATIVE PERCENT: 12.7 %
NEUTROPHILS ABSOLUTE COUNT: 2.6 10*9/L (ref 1.8–7.8)
NEUTROPHILS RELATIVE PERCENT: 60.3 %
PLATELET COUNT: 218 10*9/L (ref 150–450)
RED BLOOD CELL COUNT: 4.45 10*12/L (ref 4.26–5.60)
RED CELL DISTRIBUTION WIDTH: 14.6 % (ref 12.2–15.2)
WBC ADJUSTED: 4.3 10*9/L (ref 3.6–11.2)

## 2021-10-02 LAB — PROTIME-INR
INR: 1.14
PROTIME: 13 s — ABNORMAL HIGH (ref 9.8–12.8)

## 2021-10-02 MED ADMIN — lactated ringers bolus 1,000 mL: 1000 mL | INTRAVENOUS | @ 15:00:00 | Stop: 2021-10-02

## 2021-10-02 NOTE — Unmapped (Signed)
Bed: 45-C  Expected date:   Expected time:   Means of arrival:   Comments:  EMS

## 2021-10-02 NOTE — Unmapped (Signed)
BIB [facility] from home. Was at work last night when right sided headache started. 325 mg Tylenol given PO for fever of 100.9. + nausea. GCS 15. Stroke screen negative per EMS.
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
## 2021-10-03 ENCOUNTER — Ambulatory Visit: Admit: 2021-10-03 | Discharge: 2021-10-04 | Discharge status: Against Medical Advice | Payer: MEDICARE

## 2021-10-03 MED FILL — CITALOPRAM 40 MG TABLET: ORAL | 30 days supply | Qty: 30 | Fill #1

## 2021-10-04 NOTE — Unmapped (Addendum)
Patient states: I was here yesterday and I'm back again for the same thing. I have the flu. I don't feel good. Patient denies worsening of symptoms. Flu+

## 2021-10-04 NOTE — Unmapped (Signed)
Pt here for body aches, cough, HA and fever.  Patient was here in the ED yesterday for the same thing and was dx with parainfluenza.  Patient states that his symptoms have gotten worse.  Symptoms started on the 15th.

## 2021-10-04 NOTE — Unmapped (Signed)
Regarding: Has the flu. Feeling very sick. Was seen in the ER. Weak.  ----- Message from Gevena Cotton sent at 10/03/2021  5:25 PM EDT -----  If you had not called [facility], what do you think you would have done?   Go to Emergency Dept

## 2021-10-04 NOTE — Unmapped (Signed)
Bed: HALL-01A  Expected date:   Expected time:   Means of arrival:   Comments:

## 2021-10-04 NOTE — Unmapped (Signed)
Upcoming Appt:  Future Appointments   Date Time Provider Department Center   10/07/2021  9:00 AM Marciano Sequin, MD [facility]       Recent:   What is the date of your last related visit?  10/02/21 [facility] ED Parainfluenza+  Return to the emergency department for worsening or severe symptoms including headache, vomiting, neck pain or stiffness, cough (especially cough with blood in it), difficulty breathing, other concerns or if worse in any way.   Related acute medications Rx'd:  none  Home treatment tried:  rest, shower      Relevant:   Allergies: Patient has no known allergies.  Medications: biktarvy, celexa   Health History: HIV  Weight: not asked      [place] [facility] patients only:  What was the date of your last cancer treatment (mm/dd/yy)?: n/a  Was the treatment oral or infusion?: n/a  Are you currently on TVEC (yes/no)?: n/a    Reason for Disposition   Headache and stiff neck (can't touch chin to chest)    Answer Assessment - Initial Assessment Questions  1. DIAGNOSIS CONFIRMATION: When was the influenza diagnosed? By whom? Did you get a test for it?      Positive parainfluienza test at hospital  2. MEDICINES: Were you prescribed any medications for the influenza?  (e.g., zanamivir [Relenza], oseltamavir [Tamiflu]).       denies  3. ONSET of SYMPTOMS: When did your symptoms start?      Not asked due to severity of symptoms   4. SYMPTOMS: What symptoms are you most concerned about? (e.g., runny nose, stuffy nose, sore throat, cough, breathing difficulty, fever)      Severe headache, unable to touch chin to chest due to ha and neck stiffness/body aches  5. COUGH: How bad is the cough?      Severe, congested cough  6. FEVER: Do you have a fever? If Yes, ask: What is your temperature, how was it measured, and when did it start?      I woke up sweaty  my body is so hot  7. RESPIRATORY DISTRESS: Are you having any trouble breathing? If Yes, ask: Describe your breathing. Able to speak in full sentences, but states he is having difficulty getting breaths  8. FLU VACCINE: Did you receive a flu shot this year? (e.g., seasonal influenza, H1N1)      N/a  9. PREGNANCY: Is there any chance you are pregnant? When was your last menstrual period?      N/a  10. HIGH RISK for COMPLICATIONS: Do you have any heart or lung problems? Do you have a weakened immune system? (e.g., CHF, COPD, asthma, HIV positive, chemotherapy, renal failure, diabetes mellitus, sickle cell anemia)        HIV+    Protocols used: Influenza (Flu) Follow-up Call-A-AH

## 2021-10-07 ENCOUNTER — Ambulatory Visit
Admit: 2021-10-07 | Discharge: 2021-10-08 | Payer: MEDICARE | Attending: Infectious Disease | Primary: Infectious Disease

## 2021-10-07 DIAGNOSIS — Z23 Encounter for immunization: Principal | ICD-10-CM

## 2021-10-07 DIAGNOSIS — Z59819 Housing instability: Principal | ICD-10-CM

## 2021-10-07 DIAGNOSIS — F4312 Post-traumatic stress disorder, chronic: Principal | ICD-10-CM

## 2021-10-07 DIAGNOSIS — B348 Other viral infections of unspecified site: Principal | ICD-10-CM

## 2021-10-07 DIAGNOSIS — B20 Human immunodeficiency virus [HIV] disease: Principal | ICD-10-CM

## 2021-10-07 MED ORDER — CITALOPRAM 40 MG TABLET
ORAL_TABLET | Freq: Every day | ORAL | 11 refills | 30 days | Status: CP
Start: 2021-10-07 — End: ?
  Filled 2021-11-01: qty 30, 30d supply, fill #0

## 2021-10-07 MED ORDER — BIKTARVY 50 MG-200 MG-25 MG TABLET
ORAL_TABLET | Freq: Every day | ORAL | 0 refills | 30 days | Status: CP
Start: 2021-10-07 — End: 2021-11-06
  Filled 2021-11-01: qty 30, 30d supply, fill #0
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
## 2021-10-14 ENCOUNTER — Ambulatory Visit: Admit: 2021-10-14 | Discharge: 2021-10-15 | Disposition: A | Discharge status: Home / Self Care | Payer: MEDICARE

## 2021-10-15 MED ORDER — LIDOCAINE HCL 2 % MUCOSAL JELLY
Freq: Three times a day (TID) | TOPICAL | 0 refills | 0 days | Status: CP
Start: 2021-10-15 — End: 2022-10-15

## 2021-10-15 MED ORDER — DOCUSATE SODIUM 100 MG CAPSULE
ORAL_CAPSULE | Freq: Every day | ORAL | 0 refills | 30 days | Status: CP
Start: 2021-10-15 — End: 2021-11-14

## 2021-10-15 NOTE — Unmapped (Signed)
Discharge instructions reviewed with patient at this time. Pt denies any questions. All belongings with pt at time of discharge. ABC's intact. Pt at baseline mental status. NAD.

## 2021-10-15 NOTE — Unmapped (Signed)
Here for gluteal pain after intercourse and might be related to the lube used/ the intercourse. Appears uncomfortable

## 2021-10-15 NOTE — Unmapped (Signed)
Pt here c/o of gluteal pain after sexual intercourse since Friday and stating the lube did not work.  Denies bleeding.  Denies any blood in stool.
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
## 2021-10-22 ENCOUNTER — Ambulatory Visit
Admit: 2021-10-22 | Discharge: 2021-10-22 | Disposition: A | Discharge status: Home / Self Care | Payer: MEDICARE | Attending: Emergency Medicine

## 2021-10-22 ENCOUNTER — Emergency Department
Admit: 2021-10-22 | Discharge: 2021-10-22 | Disposition: A | Discharge status: Home / Self Care | Payer: MEDICARE | Attending: Emergency Medicine

## 2021-10-22 DIAGNOSIS — S41111A Laceration without foreign body of right upper arm, initial encounter: Principal | ICD-10-CM

## 2021-10-22 MED ADMIN — lidocaine-EPINEPHrine (XYLOCAINE W/EPI) 1 %-1:100,000 injection 10 mL: 10 mL | @ 07:00:00 | Stop: 2021-10-22

## 2021-10-22 MED ADMIN — bacitracin ointment 1 application.: 1 | TOPICAL | @ 08:00:00 | Stop: 2021-10-22

## 2021-10-22 NOTE — Unmapped (Signed)
Pt via EMS slipped and fell at work with glasses in hands, sustained lac to palm of rt hand and forearm, picked pieces of glass from wound. Dressing applied by EMS, 800mg s IBU given enroute. C/o numbness to rt thumb. Tetanus unknown

## 2021-10-22 NOTE — Unmapped (Signed)
Bed: 45-C  Expected date:   Expected time:   Means of arrival:   Comments:

## 2021-10-22 NOTE — Unmapped (Signed)
Slipped on water, fall with lacs to hand/arm

## 2021-10-22 NOTE — Unmapped (Signed)
ED Procedure Note    Lac Repair    Date/Time: 10/22/2021 3:48 AM    Performed by: Levan Hurst, MD  Authorized by: Harriet Pho, MD    Consent:     Consent obtained:  Verbal    Consent given by:  Patient    Risks, benefits, and alternatives were discussed: yes      Risks discussed:  Infection, pain, retained foreign body and poor cosmetic result  Universal protocol:     Patient identity confirmed:  Verbally with patient  Anesthesia:     Anesthesia method:  Local infiltration    Local anesthetic:  Lidocaine 1% WITH epi  Laceration details:     Location:  Hand    Hand location:  R palm    Length (cm):  5    Depth (mm):  1  Pre-procedure details:     Preparation:  Patient was prepped and draped in usual sterile fashion and imaging obtained to evaluate for foreign bodies  Exploration:     Imaging obtained: x-ray      Foreign body noted: Questionable foreign body.      Wound exploration: entire depth of wound visualized      Wound exploration comment:  Wound fully explored, unable to identify any foreign body    Contaminated: no    Treatment:     Area cleansed with:  Saline    Amount of cleaning:  Extensive    Irrigation solution:  Sterile saline    Irrigation volume:  500    Irrigation method:  Pressure wash    Visualized foreign bodies/material removed: no    Skin repair:     Repair method:  Sutures    Suture size:  5-0    Wound skin closure material used: vicryl.    Suture technique:  Simple interrupted    Number of sutures:  8  Approximation:     Approximation:  Close  Repair type:     Repair type:  Simple  Post-procedure details:     Dressing:  Antibiotic ointment    Procedure completion:  Tolerated well, no immediate complications  Lac Repair    Date/Time: 10/22/2021 3:52 AM    Performed by: Levan Hurst, MD  Authorized by: Harriet Pho, MD    Consent:     Consent obtained:  Verbal    Consent given by:  Patient    Risks, benefits, and alternatives were discussed: yes      Risks discussed:  Infection, pain, retained foreign body and poor cosmetic result    Alternatives discussed:  No treatment  Universal protocol:     Patient identity confirmed:  Verbally with patient  Anesthesia:     Anesthesia method:  Local infiltration    Local anesthetic:  Lidocaine 1% WITH epi  Laceration details:     Location:  Shoulder/arm    Arm location: R wrist.    Length (cm):  3    Depth (mm):  1  Exploration:     Wound exploration: entire depth of wound visualized      Contaminated: no    Treatment:     Area cleansed with:  Chlorhexidine    Amount of cleaning:  Extensive    Irrigation solution:  Sterile saline    Irrigation method:  Pressure wash  Skin repair:     Repair method:  Sutures    Suture size:  5-0    Wound skin closure material used: vicryl.    Number  of sutures:  3  Approximation:     Approximation:  Close  Repair type:     Repair type:  Simple  Post-procedure details:     Dressing:  Antibiotic ointment    Procedure completion:  Tolerated well, no immediate complications  Lac Repair    Date/Time: 10/22/2021 3:54 AM    Performed by: Levan Hurst, MD  Authorized by: Harriet Pho, MD    Consent:     Consent obtained:  Verbal    Consent given by:  Patient    Risks, benefits, and alternatives were discussed: yes      Risks discussed:  Infection, pain, retained foreign body and poor cosmetic result    Alternatives discussed:  No treatment  Universal protocol:     Patient identity confirmed:  Verbally with patient  Anesthesia:     Anesthesia method:  Local infiltration    Local anesthetic:  Lidocaine 1% WITH epi  Laceration details:     Location:  Shoulder/arm    Shoulder/arm location:  R lower arm    Length (cm):  3    Depth (mm):  0.5  Exploration:     Wound exploration: entire depth of wound visualized      Contaminated: no    Treatment:     Area cleansed with:  Saline    Amount of cleaning:  Standard    Irrigation solution:  Sterile saline    Irrigation method:  Pressure wash  Skin repair:     Repair method: Sutures    Suture size:  5-0    Wound skin closure material used: vicryl.    Suture technique:  Simple interrupted    Number of sutures:  3  Approximation:     Approximation:  Close  Repair type:     Repair type:  Simple  Post-procedure details:     Dressing:  Antibiotic ointment    Procedure completion:  Tolerated well, no immediate complications
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
## 2021-10-25 ENCOUNTER — Other Ambulatory Visit: Payer: Self-pay

## 2021-10-25 ENCOUNTER — Emergency Department
Admission: EM | Admit: 2021-10-25 | Discharge: 2021-10-25 | Disposition: A | Discharge status: Home / Self Care | Payer: Medicare HMO | Attending: Emergency Medicine | Admitting: Emergency Medicine

## 2021-10-25 DIAGNOSIS — Z4800 Encounter for change or removal of nonsurgical wound dressing: Secondary | ICD-10-CM | POA: Insufficient documentation

## 2021-10-25 DIAGNOSIS — M7989 Other specified soft tissue disorders: Secondary | ICD-10-CM | POA: Insufficient documentation

## 2021-10-25 DIAGNOSIS — T8130XA Disruption of wound, unspecified, initial encounter: Secondary | ICD-10-CM

## 2021-10-25 DIAGNOSIS — T8131XA Disruption of external operation (surgical) wound, not elsewhere classified, initial encounter: Secondary | ICD-10-CM | POA: Insufficient documentation

## 2021-10-25 DIAGNOSIS — Z5189 Encounter for other specified aftercare: Secondary | ICD-10-CM

## 2021-10-25 NOTE — ED Provider Notes (Signed)
    [facility] Emergency Department Provider Note     Event Date/Time   First MD Initiated Contact with Patient 10/25/21 1620     (approximate)   History   Wound Check   HPI  Spencer Klein is a 31 y.o. male presents to the ED for interim wound check.  Patient apparently lost the stitch from laceration repaired in outside facility.  He denies any fevers, chills, or sweats.   Physical Exam   Triage Vital Signs: ED Triage Vitals [10/25/21 1604]  Enc Vitals Group     BP 116/82     Pulse Rate (!) 101     Resp 16     Temp 98.5 F (36.9 C)     Temp Source Oral     SpO2 94 %     Weight 127 lb (57.6 kg)     Height 5\' 9"  (1.753 m)     Head Circumference      Peak Flow      Pain Score 0     Pain Loc      Pain Edu?      Excl. in GC?     Most recent vital signs: Vitals:   10/25/21 1604  BP: 116/82  Pulse: (!) 101  Resp: 16  Temp: 98.5 F (36.9 C)  SpO2: 94%    General Awake, no distress. NAD CV:  Good peripheral perfusion.  RESP:  Normal effort.  ABD:  No distention.  SKIN:  Right  hand with a thenar prominence laceration with some mild dehiscence. No induration, purulence, warmth, or erythema    ED Results / Procedures / Treatments   Labs (all labs ordered are listed, but only abnormal results are displayed) Labs Reviewed - No data to display   EKG    RADIOLOGY   No results found.   PROCEDURES:  Critical Care performed: No  Procedures   MEDICATIONS ORDERED IN ED: Medications - No data to display   IMPRESSION / MDM / ASSESSMENT AND PLAN / ED COURSE  I reviewed the triage vital signs and the nursing notes.                              Differential diagnosis includes, but is not limited to, wound infection, wound dehiscence, cellulitis   Patient's presentation is most consistent with acute, uncomplicated illness.  Patient to the ED for interim wound check.  Patient's diagnosis is consistent with wound  dehiscence. Patient will be discharged home with wound care instructions. Patient is to follow up with a local urgent care as needed or otherwise directed. Patient is given ED precautions to return to the ED for any worsening or new symptoms.  FINAL CLINICAL IMPRESSION(S) / ED DIAGNOSES   Final diagnoses:  Wound dehiscence  Visit for wound check     Rx / DC Orders   ED Discharge Orders     None        Note:  This document was prepared using Dragon voice recognition software and may include unintentional dictation errors.    12/25/21, PA-C 10/27/21 0015    12/27/21, MD 10/27/21 [phone]

## 2021-10-25 NOTE — ED Triage Notes (Signed)
Pt arrives with c/o wound check. Pt wants his laceration stitches checked because one came out.

## 2021-10-25 NOTE — Discharge Instructions (Addendum)
Keep the wound clean, dry, and covered.  °

## 2021-10-31 NOTE — Unmapped (Signed)
[facility] Refill Coordination Note    Specialty Medication(s) to be Shipped:   Infectious Disease: Biktarvy    Other medication(s) to be shipped: No additional medications requested for fill at this time     Fran Lowes, DOB: 1990/08/08  Phone: There are no phone numbers on file.      All above HIPAA information was verified with patient.     Was a Nurse, learning disability used for this call? No    Completed refill call assessment today to schedule patient's medication shipment from the [facility] [phone]).  All relevant notes have been reviewed.     Specialty medication(s) and dose(s) confirmed: Regimen is correct and unchanged.   Changes to medications: Easter reports no changes at this time.  Changes to insurance: No  New side effects reported not previously addressed with a pharmacist or physician: None reported  Questions for the pharmacist: No    Confirmed patient received a Conservation officer, historic buildings and a Surveyor, mining with first shipment. The patient will receive a drug information handout for each medication shipped and additional FDA Medication Guides as required.       DISEASE/MEDICATION-SPECIFIC INFORMATION        N/A    SPECIALTY MEDICATION ADHERENCE     Medication Adherence    Patient reported X missed doses in the last month: 0  Specialty Medication: BIKTARVY 50-200-25 mg  Patient is on additional specialty medications: No                                Were doses missed due to medication being on hold? No    Biktarvy 50-200-25 mg: 5 days of medicine on hand        REFERRAL TO PHARMACIST     Referral to the pharmacist: Not needed      [facility]     Shipping address confirmed in Epic.     Delivery Scheduled: Yes, Expected medication delivery date: 11/01/21.     Medication will be delivered via Same Day Courier to the prescription address in Epic WAM.    Unk Lightning   [facility] Specialty Technician

## 2021-11-06 ENCOUNTER — Ambulatory Visit: Admit: 2021-11-06 | Discharge: 2021-11-07 | Payer: MEDICARE

## 2021-11-06 ENCOUNTER — Ambulatory Visit
Admit: 2021-11-06 | Discharge: 2021-11-07 | Payer: MEDICARE | Attending: Student in an Organized Health Care Education/Training Program | Primary: Student in an Organized Health Care Education/Training Program

## 2021-11-06 DIAGNOSIS — F39 Unspecified mood [affective] disorder: Principal | ICD-10-CM

## 2021-11-06 DIAGNOSIS — B20 Human immunodeficiency virus [HIV] disease: Principal | ICD-10-CM

## 2021-11-06 DIAGNOSIS — S61411D Laceration without foreign body of right hand, subsequent encounter: Principal | ICD-10-CM

## 2021-11-06 DIAGNOSIS — S61511D Laceration without foreign body of right wrist, subsequent encounter: Principal | ICD-10-CM

## 2021-11-06 NOTE — Unmapped (Signed)
Domestic Violence: Intimate Programme researcher, broadcasting/film/video Violence Resources:   [employer] Program    The [place] [address] [facility]) IAC/InterActiveCorp provides comprehensive, coordinated care to [facility]'s patients, families, and employees experiencing a variety of interpersonal abuse. It includes services for children, victims of domestic abuse, or intimate partner violence (IPV), human trafficking, sexual assault, and the elderly and vulnerable populations. The program provides medical and psychological assessments, brief supportive counseling, and education for patients, employees, and staff. Any patient who has experienced physical abuse, threats, emotional abuse, sexual abuse, or other violence is eligible.  Microsoft  Evaluation  Brief counseling  Support  Consultations  Medical evaluations (child abuse)  Information and resources about intimate partner and family violence  Referrals  Location of KeyCorp Program Staff: [facility], but can provide most of their services to any Pathmark Stores site  The Timken Company: [phone]  Hours of Operation: 9:00 AM to 4:30 PM Monday-Friday (except holidays)    Domestic Violence Resources by [facility]: Family Abuse Services of [facility]  [address] [place], [place] [address]  Office: [phone]  Crisis: [phone]  Website: familyabuseservices.[facility]: [facility] Violence Prevention Services  Address: [address] [place], [place] [address]  Office: [phone]  Crisis (24 hour): [phone]-[phone])  Domestic Violence Helpline (24 hour): 7725417153  Website: VanityProfits.be    [facility]: [facility]  Address: [address] [place], [place] [address]  Office: [phone]  Crisis: [phone]  Fax: [phone]  Website: durhamcrisisresponse.[facility] for Women and Families  Address: [address] [place], [place] [address]  Office [phone]  24-hr Crisis: [phone]  Crisis (toll free): [phone]  Website: CyberComps.hu.[facility]: Interact  Address: [address] [place], [place] [address]  SAFE Center: [phone]  DV Crisis Line: 2565973731  SA Crisis Line: [phone]  Office: [phone]  Fax: [phone]  Website: interactofwake.org      Resources for Additional Counties in [place]    There are domestic violence programs that serve each county in [place]. To find the program in your county, please visit: FileDoors.com.br. You can also call the Aon Corporation Violence to locate your local program.     H. [employer]. [employer] Against Domestic Violence business phone: [phone]   Food: Erskine Emery and Liz Claiborne Food Resources:   [place] COUNTIES - FOOD PANTRIES/EMERGENCY FOOD RESOURCES    To learn about whether you may be eligible for Meals on Wheels in [place], [place] please call [phone].    To learn about whether you may be eligible for Meals on Wheels in [facility] please call [phone].    To learn about whether you may be eligible for Meals on Wheels in [facility] please call [phone].    To learn about whether you qualify for Food and Nutrition Services, or Food Stamps, you can apply online with ePass using this link: https://epass.kabucove.com , apply in person at your local department of social services by calling  [phone] for [facility] or [phone] for [facility], or complete a paper application using this link: https://files.AMRegister.tn.pdf and drop it off at your local Department of Social Services(DSS) located at in your county. DSS in [facility] is located at [address], [place], [place] [address] and DSS in [place] is located at [address], [place], [place] [address].  Hexion [employer]s Psychiatrist (Updated 03/15/2019)  FP= Pantries receiving Therapist, art Phone Address Days & Hours Info & Requirements   Ashland Zimmerman ([place]) [phone] 1415 [address] Tues. & Thurs.  9:00am-12:00pm Call for appt.  1x per 3 months   Believers for [facility]) [phone] [address] Tues. 12:30 The PNC Financial, food items, Mon-Fri 3-6p   United Auto [phone] [address] Wed 10a-12p Stay in car, clear space in trunk for food; Kids summer camp, kids meals after school   [place] ([place]) [phone] 214 [address] Thurs 1:30-3p Must have photo ID, Senior Health Education - Thurs 9:30a; Diabetic class - Wed 1p; Immigration, substance abuse & HIV   [place] ([place]) [phone] [address] 3rd Thursday Call at 9a, Pickup at 6:30p Call to reserve box; Clothes closet   [facility] [phone] [address]. 1st Mon. 2:30p Arrive early to register, stay in car   Capital One [phone] [address] Wed.; 10a-1p; Wed 5-7p;  Thurs 10a-1p limit 1x per month; Open for All; Accessible for Golden West Financial Assembly [phone] [address] 2nd & 4th Thurs. 4-6pm Call to confirm; Limit 1x per 3 months   Church at the [facility]) [phone] [address] 1st Saturday 8a-12p Offers counseling   [facility] [phone] [address] 2nd Wed. 6-6:30p Call for appt., photo ID   FPL Group Pantry ([place]) [phone] [address] [facility] [place] Wed & Thurs 10a-1p; Wed 5-7p Adult ID, Curb pickup with trunk open; Corporate investment banker; Speaks Spanish    Delphi pantry (ICNA Relief) (FP)  [address] [place] at [place] Ibab Ar Ryland Group 2nd Sat 2-4p Drive thru pickup, Stay in car, Photo ID; Food delivery test: [phone] or email [email or web address]; Emergency Food; Speaks Arabic and Spanish   [facility] [phone] or 104 [address] By appointment Drive by [employer]. entrance parking lot; hygiene and incontinent materials   Hexion [employer]ty [employer]s Spanish SDA [place] ([place]) [phone] [address] 2nd Tues. 2-4pm [address]   Almedia Balls Mena ([place]) [phone] [address] 1st & 3rd Thurs.  10:30a-12p Photo ID; Clothes closet   Fed Up Fresh Produce Distribution  [address] [employer]) Every Other Friday - 2/12, 2/26, 3/12, 3/26 Produce distribution in parking lot; Food also delivered - fill out form at: http://bit.ly/fedupjune26   Feed My Sheep (FP) [phone] [address] 2nd &4MG Sat.  9am-10:30a Stay in car, open trunk   [address] [place] ([place]) [phone] [address]. Tues. & Thurs.  10a-12pm Photo ID, Children's Clothes Closet by appointment   [facility]  [address] [place] Wed. 12p-1p Outside Pick Up   [place] of [place] [phone] [address] Tu 5-6:30p;   3rd Sat 10a-12p Call for appointment   Bernardo Heater [facility]) [phone] [address]  3rd Thurs.   3p-5:30p Food delivery for homebound seniors and other upon request   Bernardo Heater  [phone] [address]  Emergency Food:   Tu 12-1pm  4th Sat 10a-12p Clothing, household, and baby items   [place] [phone] [address] 3rd Wed.  [address] Tensed) [phone] 26[address] [facility]. 9:30-11:30a Walk-in, Emergency funds for rent or utilities (signup sheet onsite or call to register)   Hannah's  Kitchen @ Greater [place] ([place]) [phone] [address] 1st Tue, 2nd & 4th Wed 12-2p Call for Appt.   [facility] [phone] [address] 1st & 4th Sat  [address] Lyndal Rainbow Celebracion) [phone] [address], [address] Thu 4-6N Drive thru, Note signs for instructions; Speaks Spanish   [facility]) [phone] [address] 1st Sat. 7:30-11a   (Ticket- 7a) Must arrive by 7am for ticket; Speaks Spanish   Belia H[facility]) [phone] [address] Wed 2-4p Stay in car, clear space in trunk for food; Speaks Spanish   Frontier Oil Corporation [facility] [phone] [address] 1st &3rd Wed.(Oct-Nov: 2nd & 4th)  10a-1pm limit 1x per month   [address] [place] of [place] [phone] [address] 1st & 3rd Tues.  2-3pm    The TJX Companies ([place]) [phone] [address] Tue 1-3p Stay in Car, Open Trunk; [facility] residents   Land O'Lakes [phone] [address]  Currently not offering services due to AES Corporation [phone] [facility] [address] Wed. 5-6 PM Open to all   [facility], [facility] V[facility]quez [phone] [address] Wed 6-7p Call to confirm pantry day & time   [facility] ([place]) [phone] [address] Wed. 10a-12p Arrive early, 1x per month, Photo ID; Speaks Spanish   [facility] of [place] [phone] [address] 3rd Sat. 7-10a Photo ID; Curbside; Call for Emergencies   [facility] - Fish & Loaves [phone] [address] 2nd & 4th Wed.  4:30-5:30p    M[address]. [address] of [place] ([place] [address]  By appointment Call for Appt, Food Delivery upon request, Toiletries, financial assistance, tutoring - Mon thru SPX Corporation 6-7:30p   Mt. Level [facility]ary [address] - Two Fish, Five Loaves ([place]) [phone] [address] 3rd & 4th Thu 4-7p;   TFPAP - 2nd Thu 5-7p    [place]. [address] [place] ([place]) [phone] [address] Mon-Thurs 12:30 pickup Call to reserve box 7-8:30a, Photo ID, 1x per month   [facility] [phone] [address] Sat 11a-1p  Bag lunch pick-up Tues, Wed, Thurs 12-1p Curbside pickup only    [address] Association ([place]) [phone] 1st & 3rd Thu 10a-12pm  TRFAP - 4th Tues 11:30a-1:30p    [facility] [place] [facility] [phone] [address] Mon 9a-12p  Thu 9:30a-12:30p Photo ID; Assistant with rent & utilities   Obedience to the Word [phone] [address] [place] Tues & Thurs by State Street Corporation for appointment   [employer]way Inc ([place]) [phone] [address] [place], [place], [place] NKN  4:30-6:30p Call for appointment   Project of [facility] Pantry @ World [place]onal[place]on [phone] [address] [facility] Sat 11a-3p    [place] Nurse, mental health ([place]) ([place]) [phone] [address] 1st & 3rd Sat   9-10a    [address] [employer]ys [employer] Us ([place]) [phone] [address] Wed. 9:00am-12:00pm Photo ID, Limit 1x per month   Pathmark Stores [phone] ext. [address] Fridays 1:30-2:30p Photo Id; While supplies last; Emergency services (food, clothing, rent, utilities)   Seed Time & Harvest Fellowship [phone] [address] Thurs 11:30a Call to confirm   Pacific Mutual of [place] ([place]) [phone] [address]  Wed. 9am 1x per month   St. Golden Pop [place] ([place]) [phone] [address] 2nd & 4th Wed.  2-3pm Drive Dover Corporation; US Airways; Clothes/Household supplies   Such a Much [facility]ries [phone] [address] Boneta Lucks. E 1st, 4th, 5th Sun  3rd & 5th Sat  2-3pm    The NiSource Prophetic [phone] [address]  Currently not offering services due to COVID   VF Corporation Hydrologist [employer]) @ [place]. [address] [place] ([place]) [phone] [address] 4th Wed 10a (Jan 27, Feb 24, March 24) Church parking lot - Distribution of Astronomer from garden   [place] for Enterprise - Child's Cry [phone] [address] 4th Sat 10a-12p Photo ID; 1x per 3 months; Call for emergency services   Ross Stores [facility]) [phone] [address] Tues 10a-12p 2 families per car limit; Drive/Walk up,   Parking lot corner of [place] and [address]   [place] Ministries (including [place]. [facility] [place]) ([place]) [phone] [facility] [address] Last Thurs of the month  11a - 1p Call for appointment; Call for emergencies; Photo ID & Proof of Address; Emergency Food    Network of Families Pantry  [phone]   Elna Breslow ([email or web address]) Call or email Elna Breslow for assistance Child within the [facility] System; Connects families to local resources   UGI Corporation ([place]) [phone] [address] 2nd Sat 11a-1p     Word Empowerment Church [phone]. 2nd Friday   9a-12p Seniors only         [facility] Resources    End Hunger Reserve   Comprehensive website with links to websites and documents with various food resources.    RackRewards.fr    Nordstrom Calendar (Updated 03/16/2019)  zutyjj.com.pdf    Occidental Petroleum users to PACCAR Inc of No [place]ad[place]o Petroleum Corporation for Kids sites, Farmers??? Markets, Stage manager, and Supermarkets/Warehouse Clubs/Supercenters. Resource available in [place] & Spanish via the link below.   https://durhamnc.maps.arcgis.com/apps/Nearby/index.html?appid=[email or web address]    EAT [place] delivers DPS school meals  DPS School Nutrition Services is providing meals to all children 0-18 year sold! Meals are available for pick up Tuesdays and Thursdays from 11-12:30 pm at twenty-five school sites. At this time, all meals are free for all children through August 17, 2019 regardless of free or reduced-price eligibility status.  Call DPS School Nutrition to preregister and visit website for more information.   Phone: [phone]  Website: WedMap.it    Food Not Bombs [address] [place] [address] [place], [address], & [place])   Offering non-perishable food, produce, Armed forces training and education officer, pet, and basic household items. Use the following link to complete form (accessible in [place] & Spanish). Delivery options available. https://docs.google.com/forms/d/e/1FAIpQLSfQs5q_[email or web address]_ojnF--[email or web address]/viewform        Verizon Distribution in [place], [place] (Updated 03/22/2019)  Comprehensive document of food resources in [place].  InvestmentBrowse.at    Food Not Bombs [address] [employer], [place], & [place])   Offering non-perishable food, produce, Armed forces training and education officer, pet, and basic household items. Use the following link to complete form (accessible in [place] & Spanish). Delivery options available.  https://docs.google.com/forms/d/e/1FAIpQLSfQs5q_[email or web address]_ojnF--[email or web address]/viewform    Librarian, academic   Delivery grocery service offers to [facility] residents 60 years and older. To register for this program, complete the online request form or call the Aging Helpline.   Online Request Form: https://airtable.com/[email or web address]  Aging Helpline: [phone]    Althea Grimmer???s  Market  [address] [place], [place] [address]  Hours: Saturdays from 9a-12p  Accepts SNAP/EBT. Many vendors offer preordering, pickup, and home delivery. Visit link for more information: DemocraticPeople.com.cy    RENA Center Food Bank  Offers a bag of foods and canned goods every 3rd Friday from 10am-3pm at [facility] - [address] [place]  Visit website for more information and application: https://www.renacommunitycenter.com/esl    Lunch for adults age 45 and older:   Pickup boxed lunch 3 times a week - MWF (enough food for 5 meals) Must be eligible and an [facility] resident.  Call [phone] to enroll. Only for seniors.    Food Bank at Burlington [employer] Santa Fe and Graybar Electric on Wednesday from 10-11:30am at [facility] and Ride Lot.   Directions: from [facility] Albina Billet. Turn onto CenterPoint [address]. Go 0.6 miles, just past [address], [address], and turn into the Park-and-Ride Lot.   Open to all.     Food Bank at [facility]  Every 2nd Thursday from 9:30-11am at [facility] - [address] in [place]  Open to all.     Food Bank at Altria Group  Every 4th Thursday from 9:30-11am at [facility] - [address]  Open to all.     Consolidated Edison  Every 1st, 3rd, and 5th Saturday of the month from 11a-12p at [facility] - [address], [place], [place] Parking Lot. A joint ministry of [address] and [address]. 2 Bags of Food.   Open to all.     [facility] The PNC Financial Pick-up   Monday-Friday at various times. Provided by [facility] Cendant Corporation and Dillard's. Meals can be picked up from any one of 17 locations. For children ages 50-20 years old.   See website for more information: SplashPops.ca.aspx?PageType=3&DomainID=4&ModuleInstanceID=30&ViewID=[id number]&RenderLoc=0&FlexDataID=[id number]&PageID=1    SHEETZ  Free daily kids meals, while supplies last. The local participating station is [address], [place], [place] [address]  Only for children.     Inter-Faith Council (IFC) Fortune Brands for pickup daily at [address] [place]. Monday to Saturday 11:15-12:30pm and Sunday 12:15-1:30pm  Open to all.    Inter-Faith Council (IFC) Dinner  NiSource for pickup, Monday-Friday 6:15-7pm at [address] [place]  Open to all.    Inter-Faith Council (United States [employer] Corporation) Groceries  Provides any household with a week???s worth of groceries once per month.   Call [phone], 9a-5p to make appointment.  Open to all.    TABLE  Home-based delivery of bags of healthy non-perisables and fresh food to families with children in the [facility] system.   To request food visit: https://www.page-knight.com/    [facility]  [place] food program for families with children in the [facility] Cendant Corporation. If you have children who currently attend school in [place] or [place], contact chc@porchcommunities .org to ask about enrollment options    [employer] Box Program  Distributes a box of non-perishable groceries once a month to qualified [facility] residents who are over 60. To enroll email shmcpherson@orangecountync .gov or call [phone].      ADDITIONAL FOOD RESOURCES    Food Bank of [address] and [facility]   [address] [place], [place] [address]  Phone: [phone]     Food Stamps Chief Strategy Officer)   Contact your local DSS office      Interfaith Food Shuttle  [address] [place], [place] [address]     Phone: [phone]     211/UNITED WAY Helpline   Phone: 211 or [phone]      Mental Health: General Mental Health Resources: Suicide & Crisis Hotlines   The Hopeline   Phone: 308-314-3171    Online chat: www.hopeline.com    [place]   Phone: 1-800-SUICIDE (780-667-0444)    National Suicide Prevention Lifeline    Phone: 3-329-518-ACZY (520-483-7297)   Online chat: www.suicidepreventionlifeline.org    Online chat (ages 21-24): https://gibson.com/    How to Find Mental Health Treatment   If you have health insurance, look on your insurance card for the phone number or website to access a list of mental health providers who accept your insurance. For Medicare covered services go to [email or web address]-[email or web address]    Below is a list of local agencies that accept multiple insurance carriers. Please contact them directly about acceptance of your insurance.      Silver Linings for Seniors: 9281631986   Can provide in-office, tele-health, or home therapy sessions for select counties  Offers individual and family counseling  Office location: [address], [address], [place], [place] [address]   Website: https://www.miller-reyes.info/  Email: info@silverliningsnc .com     [facility] Behavioral Health and Psychiatry, PA  [place] office: [phone] - fax: [phone]  [facility] office: [phone] - fax: [phone]     [facility]: [phone]  Accepts most major insurers. Counseling and psychiatric treatment available. In-person and teletherapy options.  Locations across [place]:  [address], [place], [address], [place], [place], [place], [place], [place], [place], [place], [place], [facility], [place], [place]     [place] Care:   Accepts most major insurers. Counseling and psychiatric treatment available. Substance abuse treatment care available  Locations:  [place] OFFICE: [phone]  [facility] OFFICE: [phone]  [facility] OFFICE: [phone]      [facility]:  Accepts most major insurers. Hovnanian Enterprises, and Longterm Supports and Services. In-person and teletherapy options.  Locations across [place] outside of the triangle area, go to: BelizeBus.at    Three WellPoint and Wellness Office: [phone] (located in [place], [place], [place] and [place])    [facility] in [facility]:  Phone: [phone]  Located in: [facility] at [facility] [address], [place], [place] [address]  Hours: M-F 9-5 Lisl [facility])    Therapy for Agilent Technologies Girls: therapyforblackgirls.com or info@therapyforblackgirls .com    [facility] Mental Health Resources: www.lgbtcenterofraleigh.com/mental-health     For other clinic based services, search for: local mental health clinics for other possible clinics near you.    Online therapy option:  betterhelp.com  The cost of therapy through BetterHelp ranges from $60 to $90 per week (billed every 4 weeks). You can cancel your membership at any time, for any reason. You're matched with a therapist based on your objectives. After signing up, getting a therapist can take a few hours or a few days, depending on therapist availability. Subscription period starts when you are matched with a therapist. For more information go to Frequently Asked Questions at DermatologySites.com.pt    [facility] Collective: NetworkingProfiles.no    Nonprofit allows therapists to provide affordable, in-office and online psychotherapy sessions between $40 and $70 ($30 for student intern sessions)  Lifetime membership fee of $65    For other online therapy options reviews and links go to:  MobileMusical.fi      If you have Medicaid look at the back of your Medicaid  card for the Member line or Hovnanian Enterprises number  Below are a number of the managed Medicaid member line and behavioral health crisis line numbers. For help with issues with any managed Medicaid entity, call the [facility] Medicaid Ombudsman [phone]  Monday through Friday 8 am to 5 pm except for state holidays.  [facility] Loews Corporation [phone])   Available 24 hours a day, seven days a week   KeyCorp Crisis Line [phone])   Available 24 hours a day, seven days a week     [facility] Complete Health  Member Services Line [phone]: [phone])   Available 7 am- 6 pm Monday through Sunday   Behavioral Health Crisis Line [phone]   Available 24 hours a day, seven days a week     [place] Reserve Option   Member Services Line: [phone])   Available 8 a.m. to 4:30 p.m., Monday through Friday   Behavioral Health Crisis Line [phone]   Available 24 hours a day, seven days a week    Healthy [employer] [phone] (TTY 711)    Available 24 hours a day, seven days a week  Behavioral Health Crisis Line [phone] (TTY 711)      Available 24 hours a day, seven days a week     [employer] [phone] 6048470346   Available 24 hours a day, seven days a week   [employer] [phone]   Available 24 hours a day, seven days a week     [employer]: [phone] (TYY: 711)   Available 7 a.m. to 6 p.m., Monday through Saturday   Behavioral Health Crisis Line [phone]   Available 24 hours a day, seven days a week     For patients with no insurance or Direct Medicaid see below for your county's [facility] (Managed Care Organization).  Call the phone number to schedule an appointment.      [facility]  Phone: [phone]   Crisis Line: [phone]    Website: www.[email or web address].org    [facility])  Address: [address] [place], [place] [address]  Phone: [phone]  Hours: M-F 8am-8pm; Saturday and Sunday 8am-4pm    [facility])  Address: [address] [place], [place]  Phone: [phone]  Hours: 24 hours a day    Mental Health Division, [facility] Department [facility])  Address: [address] [place]. [place], [place] [address]  Phone: [phone]  Hours: M-F 8am-5pm    Vesta Mixer [facility])  [address] [place], [place]  Phone: [phone]  Hours: M-F 8am-2pm   [facility])  [address] Kendell [place], [place]   Phone: [phone]  Hours: 24 hours    [facility])  Address: [address] [place], [place]  Phone: [phone]  Hours: 24 hours    [facility]  Phone: [phone]         Crisis Line: [phone]       Website: www.[email or web address]     [facility])  [address] [place], [place]  Phone: [phone]  Hours: M-F Bevely Palmer [phone]    [facility])   [address] [place], [place]  Phone: [phone]  Hours: M-F 8am-5pm    [place] - CALL [phone]    [facility])  [address] [place], [place]  Phone: [phone]  Hours: M-F 8am-5pm    [place] - CALL [phone]     [facility])  [address] [place], [place]  Phone: [phone]  Hours: M-F 8am-5pm    PORT [facility])  [address] [place], [place]   Phone: [phone]  Hours: M-F 8am-5pm    [place]- CALL [phone]    [place] [phone]    [place] [phone]    [facility])  [address] [place], [place]  Phone: [phone]  Hours: M-F 8am-5pm    Integrated Family Services [facility])   [address] [place], [place]  Phone: [phone]  Hours: M-F 8am-5pm; Tues 8am-9pm     [facility])  [address] [place], [place]  Phone: [phone]  Hours: EVERY OTHER Tuesday 8am-5pm    [place]- CALL [phone]    [facility]- CALL [phone]    Vesta Mixer [facility])  [address] [place], [place]  Phone: [phone]  Hours: M-F 8am-5pm    [facility])  [address] [place], [place]  Phone: [phone]  Hours: M-F 8am-5pm; Saturday 10am-6pm    Integrated Family Services [place])   [address] [place], [place]  Phone: [phone]  Hours: M-F 8am-5pm    [facility])  [address] [place], [place]  Phone: [phone]  Hours: 24 hours a day    [place] [phone]    Integrated Family Services [facility])  [address] [place], [place]  Phone: [phone]  Hours: M-F 8am-5pm    [facility])  [address] [place], [place]  Phone: [phone]  Hours: M-F 8am-5pm; Sat-Sun 10am-6pm    PORT [facility])  [address] [place], [place]  Phone: [phone]  Hours: Wednesday 8am-5pm    PORT [facility])  [address], Suite [place], [place] [address]  Phone: [phone]  Hours: M-F 8am-5pm    [place] - CALL [phone]    [place] [phone]    [place]  Phone: [phone]   Crisis Line: [phone]   Website: [email or web address]     [employer] [facility])  [address] [place], [place]  Phone: [phone]  Hours: M-F 8am-5pm    [facility])  [address] [place], [place]  Phone: [phone]  Hours: M-F 8am-5pm    Vesta Mixer [facility])  [address] [place], [place]  Phone: [phone]  Hours: M-F 8am-5pm    Vesta Mixer [facility])  [address] [facility] [address] [place], [place]  Phone: [phone]   Hours: M-F 8am-5pm    PORT [facility])  [address] [place], [place]  Phone: [phone]  Hours: M-F 8am-5pm    Vesta Mixer [facility])  [address] [place], [place]  Phone: [phone]  Hours: M-F 8am-5pm    [facility])  [address] [place], [place]  Phone: [phone]  Hours: M-F 8am-5pm    Vesta Mixer [facility])  [address], [address] [place], [place]  Phone: [phone]   Hours: M-F 8am-5pm     [place]- CALL [phone]    [facility])  [address] [place], [place]  Phone: [phone]  Hours: M-F 8am-5pm    Vesta Mixer [facility])  [address] [place], [place]  Phone: [phone]  Hours: M-F 8am-5pm    [facility]  Phone: [phone]   Crisis Line: [phone]   Website: [email or web address]     [facility])  [address] [place], [place]  Phone: [phone]  Hours: M-F 9am-3pm    [facility])  [address] [place], [place]  Phone: [phone]  Hours: M-F 8am-8pm    [facility])  [address] [place], [place]  Phone: [phone]  Hours: M-F 9am-3pm    Vesta Mixer [facility])   [address] [place], [place]  Phone: [phone]  Hours: M-F 8am-5pm    [place] [phone]    [facility] [place])  [address] [place], [place]   Phone: [phone]  Hours: M-F 8am-5pm    Vesta Mixer [facility])  [address] [place], [place]  Phone: [phone]  Hours: M-F 8am-5pm     [facility])  [address] [place], [place]  Phone: [phone]  Hours: M-F 8am-5pm    Vesta Mixer [facility])  [address] [place], [place]  Phone: [phone]  Hours: M-F 8am-5pm    Family Preservation Services ([place])  [address] [place]  Phone: [phone]  Hours: M-F 8a-5p    [facility])  [address] [place], [place]  Phone: [phone]  Hours: M-F 8am-8pm    [facility] [place])  [address] [place], [place]  Phone: [phone]  Hours: M-F 8am-5pm    Floydene Flock Recovery Services [facility])  [address] [place]. [place], [place]   Phone: [phone]  Hours: M-F 8am-8pm    [facility])  [address] [place], [place]  Phone: [phone]  Hours: M-F 9am-3pm    [facility]  Phone: [phone]   Crisis Line: [phone]   Website: [email or web address]     [facility] Recovery Services [facility])  [address] [place], [place]   Phone: [phone]  Hours: M-F 8am-5pm    [facility] Services [facility])  [address] [place], [place]  Phone: [phone]  Hours: M-F [facility])  [address] [place], [place]  Phone: [phone]     [facility] Recovery Services [facility])  [address] [place], [place]  Phone: [phone]  Hours: M-F 8am-5pm    [facility])  [address] [place], [place]  Phone: [phone]  Hours: M-F 8am-5pm    [facility] Recovery Services [facility])  [address] [place], [place]  Phone: [phone]  Hours: M-F 8am-5pm    [facility])  [address] [place], [place]  Phone: [phone]  Hours: M-F 8am-5pm    [facility])  [address] [place], [place]  Phone: [phone]  Hours: M-F 8am-5pm    [facility] Recovery Services [facility])  [address] [place], [place]  Phone: [phone]  Hours: M-F 8am-5pm    [facility])  [address] [place], [place]  Phone: [phone]  Hours: M-F 8am-5pm     [facility] Recovery Services [place])   [address] [place] [address] [place], [place]  Phone: [phone]  Hours:  M-F 8am-5pm      [facility] HEALTH  Phone: [phone]   Crisis Line: [phone]   Website: [email or web address]   [facility])  [address] [place], [place]  Phone: [phone]  Hours: Sun-Sat 8am-8pm    R[facility]al Health [facility])  [address] [place], [place]  Phone: [phone]  Hours: M-F 8am-5pm    [facility] Recovery Services [facility])  [address] [place], [place]  Phone: [phone]   Hours: M-F 8am-5pm    [facility] Recovery Services [facility])  [address] [place], [place]  Phone: [phone]  Hours: M-F 8am-5pm    [facility] Services [facility])  [address], [place]  Phone: [phone]  Hours: M-F 8am-5pm    Family Preservation Services [facility])  [address] [place], [place]  Phone: [phone]  Hours: M-F 8am-5pm     RHA [employer] Services [facility])  [address] [place], [place]  Phone: [phone]  Hours: M-F 8am-5pm      [place]- CALL [phone]    [facility] Services - [facility])  [address] [place], [place]   Phone: [phone]  Hours: M-F 9am-4pm    [address] [place])  [address] [place], [place]  Phone: [phone]  Hours: M-F 9am-5pm    [facility])  [address] [place], [place]  Phone: [phone]  Hours: M-F 9am-5pm    [place] - CALL [phone]    [facility])  [address] [place], [place]  Phone: [phone]  Hours: M-F 9am-5pm    [place] - CALL [phone]    [facility])  [address] [place], [place]  Phone: [phone]  Hours: M-F 9am-5pm    Family Preservation Services [facility])  [address] [place], [place]  Phone: [phone]  Hours: M-F 8am-5pm    [facility])  [address] [place], [place]  Phone: [phone]  Hours: M-F 9am-5pm    [employer] Services [facility])  [address] [place], [place]  Phone: [phone]  Hours: M-F 9am-5pm    [facility])  [address] [place], [place]  Phone: [phone]  Hours: M-F 8am-5pm    [facility])  [address], Suite [place], [place]   Phone: [phone]  Hours: M-F 8am-5pm    RHA [facility])   [address] [place], [place]  Phone: [phone]  Hours: M-F 8am-5pm      [facility])  [address] [place] [place], [place]  Phone: [phone]  Hours: M-F 8am to [facility])  [address] [place], [place]   Phone: [phone]  Hours: M-F 8am-5pm    [facility] Recovery Services [facility])  [address] [place], [place]  Phone: [phone]  Hours: M-F 8am-8pm    [place] - CALL [phone]    [facility])  [address] [place], [place]  Phone: [phone]  Hours: M-F 9am-5pm    [facility])  [address] [place], [place]  Phone: [phone]     [facility] Recovery Services [facility])  [address] [place]. [place], [place]  Phone: [phone]  Hours: M-F 8am-5pm    [facility])  [address] [place], [place]  Phone: [phone]  Hours: M-F 8am-5pm    [facility] Recovery Services [facility])  [address] [place], [place]  Phone: [phone]  Hours: M-F 8am-5pm     R[facility])  [address] [place], [place]  Phone: [phone]  Hours: M-F 8a-5p             Support Group Resources:   Support Groups    AGING  [employer].  Telephone: [phone]  Fax: [phone]  [address], [place] [address], [address]  Website: alamanceeldercare.org  Hours: Monday through Saturday: 9 AM to 5 PM, closed on Sunday  During COVID: In-office appointments are suspended and care manager in-home visits are limited. They are available by telephone and email.     [facility] on Aging  Telephone: [phone]  [address] [place], [place] [address]  Website: [email or web address]  Call for information.  Hours: Monday through Friday: 8 AM to 5 PM   During COVID: Effective Monday, May 03, 2018, the Council will suspend its congregate meals program and other activities offered at its [place] and Theatre manager (in [employer] and [place]).  The centers will be closed to participants and visitors.    [facility] for Senior Life  Telephone: [phone]  [address] [place], [place] [address]  Website: www.dcslnc.org  Call for information.  Hours: Monday through Friday: 8 AM to 5 PM   During COVID: [facility] for [employer] is closed due to the pandemic as of May 07, 2018.     [facility] Department on Aging  Telephone: [phone] (Aging Information and Help Line)  Molly Maduro and [facility], [address], [place], [place] [address]  [facility], [address], [place], [place] [address]  Website: LavishToys.ch  Multiple support groups available. Call for information.  Hours: Monday - Thursday, 8 AM - 8 PM, Fridays, 8 AM - 5 PM, Saturdays, 9 AM - 11 AM  During COVID: Activities at both senior centers have not reopened (as of 06/28/2018)    ADDICTION    Al-Anon  Telephone: [phone]  Fellowship of family and friends of alcoholics.  Call for meeting times and locations.  During COVID: Office is closed during the pandemic until further notice. Visit [email or web address] for updates.     Alcoholics Anonymous  [place], [place] and [place]  Telephone: [phone]  Website: HumorVids.co.uk  or  [facility]  Telephone: [phone]  Web site: http://www.craig-choi.com/  Meetings available seven days a week. Call or go to web site for times and locations.    [facility]  Telephone: [phone]  During COVID: [place], [address] and [place]: Groups are cancelled until further notice. Continue to check www.[email or web address] for updates. [facility]: As of 05/16/2018, meetings are online and in-person. Continue to check: https://docs.google.com/spreadsheets/d/e/[id number]/pubhtml?gid=(978)[phone]&single=true for updates.     [facility]  Telephone: [phone]  [address] Felicita Gage. 118(Adolescent Clinic), [address]ld[address] 104(Adult Clinic), [place], [place] [address]   Hours: Monday through Friday: 8:30 AM to 5 PM     Gamblers Anonymous  Telephone: [phone]  [place] [address],  [address], [place], [place] [address]  Website: gamblersanonymous.org  Meets every Monday at 7:30 pm.    SMART Recovery  Telephone: [phone], [address] [place], [place] [address]  Self-help group for people with an alcohol  or other substance abuse problem. Meets Tues at 7:30 pm at  [facility] and Sat at 9:30 am at [facility] [address] [place].  During COVID: Meetings have moved online. See [email or web address] for more info.     ALZHEIMER'S, DEMENTIA, AND CAREGIVER GROUPS    Dementia Alliance of [place]  Telephone: [phone] or [phone]  [address], [place] [address]  Website: dementianc.org  During COVID: Establishing online support groups. See more information and additional COVID-related resources here: LeisureDecor.ca     [facility] Dementia Family Support Program  Telephone: [phone] or [phone]  Fax: [phone]  [facility], [address], [place], [place] [address]  [address]/ [place]  Room 3513/ [facility] Zone  Website: www.dukefamilysupport.org  Phone: 949-031-7527 or   3022101277  Email: [email or web address]  During COVID: All support groups are meeting by zoom. Contact by phone or email for additional support.     [place] Chapter of the Alzheimer's Association  Provide assistance with information, educational programs and services to patients and their families through support groups.  Telephone: [phone]  During COVID: Meetings and events will be conducted by phone and virtually. Contact them by phone for additional support.     [facility] Dementia Caregivers Group  Telephone: [phone]  [address] [place], [place] [address]  During COVID: Center is not open (as of 07/02/2018). To reach out about virtual support groups, call [phone] or email [email or web address] .[email or web address].     Project C.A.R.E (Caregivers Alternatives to Running on Empty)  Telephone: [phone]  Services: Care consultation, dementia-specfic information, caregiver education, financial assistance with respite care, etc.  Partners located throughout the state:  Western Office (P: [phone])  [facility] Agency on Aging  [address]  [place], [place] [address]  [place] Office (P: [phone])  [facility] Agency on Aging  [address] [place], [place] [address]  [place] Office (P: [phone] or [phone])  [facility] Department of Social Services  Loralee Pacas [facility]  [address]  [place], [place] [address]  [place] Office (P: [phone])   [facility] D[facility]  [place], [place] [address]  Arnell Sieving Office (P: [phone])  [place] Agency on Aging  [address]  [place], [place] [address]    Molly Maduro and [facility] Dementia Caregivers Group  Telephone: [phone]  Molly Maduro and [facility], [address], [place], [place] [address]  During COVID: Center is not open (as of 07/02/2018). To reach out about virtual support groups, call [phone] or email [email or web address] .gov.     [employer] on Aging  Telephone: [phone]  [address], [place], [place] [address]  Counties: [place], [place], [place], [place], [place], [place], and [employer]  Services: Respite services, support group, caregiver training, etc.    DOMESTIC VIOLENCE    [facility]  Support groups for women who are victims of domestic violence.  Telephone: [phone]  [address], [place], [place] [address]  Hours: Monday through Friday: 9 AM to 5 PM  During COVID: Offering services virtually and online. Business/self-sufficiency services hotline: 417-721-6736 and domestic violence services: [phone]    CAREGIVER    Patient and Caregiver Support Group - [facility]  Telephone: [phone]  [facility], [facility]  [address] [place], [place]  [address]  Meets 4th Tuesday of the month, 10:30 AM - 12 AM.  During COVID: Support groups are on hold (as of Jul 02, 2018).     Daughters as Caregivers  Telephone: [phone] or [phone]  Fax: [phone]  [facility] [address], [place], [place] [address]  Website: www.[email or web address].[email or web address]  For daughters or daughters-in-law who are caregivers.  During COVID: Support group is now on Zoom. It will occur on Wednesday, May 20th & June 3rd at 12 PM, contact Bobbi ([email or web address]) for info, [phone].     Visual merchandiser Center  Interactive approach to learning how to work with individuals with dementia (includes support groups).  Telephone: [phone]  [address], [place], [place] [address]  During COVID: Staff member will send a list of relevant support groups (as of 07/02/2018).     Family Caregiver Alliance   https://caregiver.org/  The alliance works to improve the quality of the life for caregivers and the people who receive their care.  National resources that can be filtered by state.  Services: Support with caregiver compensation, Community education officer, Radiation protection practitioner, Support Programs, etc.  DISEASE SPECIFIC SUPPORT GROUPS    For complete information on sponsored support groups contact:  Sara Lee [facility] Support  Telephone: [phone]  Information on [facility] sponsored support groups.  During COVID: Cancer Support Team Members are providing support by phone and telehealth. All support groups are cancelled (as of June 02, 2018).    CANCER    American Cancer Society  Telephone: [phone]).  During COVID: Currently not setting up or coordinating patient rides to cancer-related appointments. See https://www.cancer.org/about-us/what-we-do/coronavirus-covid-19-and-cancer/status.html for services that are being provided both by phone and virtually.   [facility]  Telephone: [phone]  [address], [place], [place] [address]  Website: www.cancer.org    HEARING    [facility] [address] and [facility], [address], [place], [place] [address]  Email: [email or web address] (preferred contact method)    MENTAL ILLNESS    [facility]  Support groups for refugee women, and adults with severe and persistent mental illness.  Telephone: [phone]  [address], [place], [place] [address]  During COVID: Providing telehealth services instead of in-person appointments. See motorcyclefax.com for more information.     [facility] [employer] [facility] [employer] for [facility]  Telephone: [phone]  Molly Maduro and [facility], [address], [place], [place] [address]  Meets 2nd and 4th Thursday at 6:30 - 9pm  During COVID: Offering virtual support groups. See webpage (http://www.herman.com/) for more info.     [employer] on Mental Illness  for [facility]  Support group for families, peer to peer support for persons with mental illness and their families.   Telephone: [phone]    [facility]  Telephone: [phone]  [address] [place], [place] [address]  Meets 2nd and 4th Thursday of each month from 1-2:30p. Call ahead to make reservation for lunch.    STROKE    Stroke Support Group  Telephone: [phone]    VISION (LOW VISION AND BLINDNESS)    [facility]  Family support groups for adjustment to low vision.  Phone Number: [phone]  [address] [place], [place] [address]    Low Vision Support Group  Telephone: [phone])  161-0960 or [phone]  Molly Maduro and [facility]  [address], [place], [place] [address]    [facility]  Telephone: [phone]    OTHER    [facility]  Family support, parenting programs, support groups.  Telephone: [phone]  [address] [place], [place], [place] [address]    [facility]  Telephone: [phone]    [facility] Support Group sponsored by [facility]  Telephone: [phone]    [facility] Parent Support Group through the [facility]  [phone]  [address] [place], [place] [address]

## 2021-11-06 NOTE — Unmapped (Signed)
[facility] at [facility]     Type of visit: face to face    Are you located in [place]? (for virtual visits only) N/A    Reason for visit: Establish Care    Questions / Concerns that need to be addressed: Routine check.    Screening BP- 120/64, 83    HCDM reviewed and updated in Epic:    We are working to make sure all of our patients??? wishes are updated in Epic and part of that is documenting a Environmental health practitioner for each patient  A Health Care Decision Maker is someone you choose who can make health care decisions for you if you are not able - who would you most want to do this for you????  was updated.      BPAs completed:  AUDIT - Alcohol Screen and HARK - Interpersonal Screen    COVID-19 Vaccine Summary  Which COVID-19 Vaccine was administered  Moderna  Type:  Dates Given:  08/05/2021     Date last COVID Positive Test:   02/03/2021               Immunization History   Administered Date(s) Administered    COVID-19 VAC,BIVALENT,MODERNA(BLUE CAP) 08/05/2021    COVID-19 VACCINE,MRNA(MODERNA)(PF) 07/12/2019, 08/09/2019, 03/15/2020    DTP 11/17/1989, 01/19/1990, 04/27/1990, 04/26/1991    DTaP, Unspecified Formulation 11/17/1989, 01/19/1990, 04/27/1990, 04/26/1991    HEPATITIS B VACCINE ADULT,IM(ENERGIX B, RECOMBIVAX) 01/25/1991    Hepatitis A 04/04/2013    Hepatitis B Vaccine, Unspecified Formulation 01/25/1991, 04/26/1991, 11/07/1992    HiB, unspecified 11/17/1989, 01/19/1990, 04/27/1990, 01/25/1991    HiB-PRP-OMP 11/17/1989, 01/19/1990, 04/27/1990, 01/25/1991    Human Pappilomavirus Vaccine,9-Valent(PF) 02/22/2018, 06/16/2018, 11/01/2018    INFLUENZA TIV (TRI) PF (IM) 01/25/2013    Influenza Vaccine Quad (IIV4 PF) 43mo+ injectable 11/21/2013, 11/06/2014, 01/21/2016, 12/01/2016, 12/08/2017, 11/01/2018, 11/10/2019    Influenza Virus Vaccine, unspecified formulation 12/08/2017    MMR 01/25/1991    OPV 11/17/1989, 01/19/1990, 04/27/1990, 04/26/1991    PNEUMOCOCCAL POLYSACCHARIDE 23 04/16/2015, 01/27/2018    PPD Test 11/16/2008    Pneumococcal Conjugate 13-Valent 07/20/2012    Polio Virus Vaccine, Unspecified Formulation 11/17/1989, 01/19/1990, 04/27/1990, 04/26/1991    Poliovirus,inactivated (IPV) 11/17/1989, 01/19/1990, 04/27/1990, 04/26/1991    TdaP 04/24/2007, 12/11/2014, 08/09/2016, 09/26/2018, 10/07/2018       __________________________________________________________________________________________    SCREENINGS COMPLETED IN FLOWSHEETS    HARK Screening       AUDIT       PHQ2       PHQ9          P4 Suicidality Screener                GAD7       COPD Assessment       Falls Risk

## 2021-11-07 NOTE — Unmapped (Signed)
Internal Medicine Initial Visit        Assessment/Plan:     Roger Bowers presents today to establish care.           1. Laceration of multiple sites of right hand and wrist, subsequent encounter    2. HIV    3. Psych: Mood disorder      1. Laceration of multiple sites of right hand and wrist, subsequent encounter  Patient had fall on outstretched hand into glass with laceration to the thenar eminence on 9/5 with initial visit in the [facility] emergency room.  Plain films at the time demonstrated a likely retained foreign body.  This was aggressively irrigated and primarily repaired with suture at bedside.  The tissue lateral to the laceration is edematous and tender to palpation.  There is no erythema or fluctuance to suggest infection.  There is no palpable foreign body.  He has somewhat reduced range of motion of the right thumb and inability to grip on exam today.  He has not been trying to move the thumb much, and it is unclear if this is normal healing, splinting due to pain, retained foreign body, or very unlikely tendon damage.  We will obtain plain films today to ensure that there is not retained foreign body that was seen on previous plain film.  We will refer him to occupational therapy to work on hand strength and assess if further referral is required pending progress.  -Sutures removed today  - AMB REFERRAL TO OCCUPATIONAL THERAPY EVAL AND TREAT; Future  - XR Hand 3 Or More Views Right; Future    2. HIV  Follows closely with [facility].  Next visit in November.  Discussed that it would be reasonable to do an anal Pap and see if his smallpox vaccine was available in ID clinic.    3. Psych: Mood disorder  Says that he is doing well and plugged into resources.  Declines any further referral for counseling or support.  Feels that things are getting back on track      Staffed with Dr. Irena Cords, seen and discussed    No follow-ups on file.        Chief Complaint:      Roger Bowers is a 31 y.o. male who presents to Establish Care for hand laceration      Subjective:     HPI  Roger Bowers is a 31 year old man with past medical history of HIV on Biktarvy presenting to establish care after recent injury to his right hand.  He was seen in the [facility] emergency room after a fall at work on an outstretched hand onto glass.  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  He was cleaning up where at the end of his shift was carrying glasses left on wet floor at work.  This caused a laceration to his right thenar eminence.  This was aggressively irrigated and repaired with suture in the emergency room since then he has had reduction in his range of motion and strength of his right hand.  This has prevented him from doing his daily tasks including cooking, cleaning, work.    Otherwise, he says that he is doing quite well.  He has follow-up with his chest disease physician soon and has no acute complaints.  He feels that his living situation is in a good spot currently and declines further social work support      The past medical history, surgical  history, family history, social history, medications and allergies were reviewed in Epic.     Review of Systems    The balance of 10 systems was reviewed and unremarkable except as stated above.        Objective:     BP 120/64  - Pulse 83  - Temp 36.8 ??C (98.2 ??F) (Temporal)  - Resp 16  - Ht 180.3 cm (5' 11)  - Wt 60.1 kg (132 lb 9.6 oz)  - SpO2 98%  - BMI 18.49 kg/m??      General: No acute distress, thin  HEENT: No cervical lymphadenopathy, normocephalic, normal dentition  Eyes: No scleral icterus, extraocular eye movements grossly intact  Musculoskeletal: Right hand with healing lacerations to the thenar eminence and ulnar wrist, lateral thenar eminence with edema, tenderness but no erythema fluctuance or palpable foreign body; thumb range of motion reduced, able to touch thumb to pinky  Skin: Healing wounds on right hand and forearm as above, no other lesions or rashes noted on clothed exam        Records Review review of previous emergency room visit and ID visits, imaging, labs, and vaccination status    PHQ-9 Score:     GAD-7 Score:         Medication adherence and barriers to the treatment plan have been addressed. Opportunities to optimize healthy behaviors have been discussed. Patient / caregiver voiced understanding.

## 2021-11-14 ENCOUNTER — Ambulatory Visit: Admit: 2021-11-14 | Payer: MEDICARE

## 2021-11-14 ENCOUNTER — Ambulatory Visit
Admit: 2021-11-14 | Payer: MEDICARE | Attending: Rehabilitative and Restorative Service Providers" | Primary: Rehabilitative and Restorative Service Providers"
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
## 2021-11-25 DIAGNOSIS — B20 Human immunodeficiency virus [HIV] disease: Principal | ICD-10-CM

## 2021-11-25 MED ORDER — BIKTARVY 50 MG-200 MG-25 MG TABLET
ORAL_TABLET | Freq: Every day | ORAL | 2 refills | 30 days
Start: 2021-11-25 — End: 2021-12-25

## 2021-11-25 NOTE — Unmapped (Signed)
[facility] has been contacted in regards to their refill of Biktarvy. At this time, they have declined refill due to patient having 30 doses remaining. Refill assessment call date has been updated per the patient's request.

## 2021-11-26 MED ORDER — BIKTARVY 50 MG-200 MG-25 MG TABLET
ORAL_TABLET | Freq: Every day | ORAL | 0 refills | 30 days | Status: CP
Start: 2021-11-26 — End: 2021-12-26

## 2021-11-26 NOTE — Unmapped (Signed)
Medication Requested: Susanne Borders      Future Appointments   Date Time Provider Department Center   11/28/2021  8:15 AM Karie Georges, OT [facility]   01/13/2022  3:30 PM Marciano Sequin, MD [facility]     Per Provider Note: Improved adherence with biktarvy.     Standing order protocol requirements met?: Yes    Sent to: Provider for signing    Days Supply Given: 30 days  Number of Refills: 2

## 2021-11-28 ENCOUNTER — Ambulatory Visit
Admit: 2021-11-28 | Payer: MEDICARE | Attending: Rehabilitative and Restorative Service Providers" | Primary: Rehabilitative and Restorative Service Providers"

## 2021-11-28 NOTE — Unmapped (Signed)
[facility]  [address] [place], [place] [address]    [phone]    This note is to let you know that Fran Lowes cancelled his scheduled Occupational Therapy follow-up session via mychart.  Please contact me if you have any questions or concerns.    Thank you for this referral,     Signed: Karie Georges, OT  11/28/2021 8:16 AM

## 2021-12-11 NOTE — Unmapped (Signed)
I saw and evaluated the patient, participating in the key portions of the service.  I reviewed the resident’s note.  I agree with the resident’s findings and plan. Graeden Bitner A Gracie Gupta, MD

## 2021-12-11 NOTE — Unmapped (Signed)
Addended by: Dolores Hoose on: 12/11/2021 12:46 PM     Modules accepted: Level of Service

## 2021-12-16 DIAGNOSIS — B20 Human immunodeficiency virus [HIV] disease: Principal | ICD-10-CM

## 2021-12-16 MED ORDER — BIKTARVY 50 MG-200 MG-25 MG TABLET
ORAL_TABLET | Freq: Every day | ORAL | 0 refills | 30 days | Status: CP
Start: 2021-12-16 — End: 2022-01-15

## 2021-12-16 NOTE — Unmapped (Signed)
Specialty Medication(s): Biktarvy 50-200-25mg     Roger Bowers has been dis-enrolled from the [facility] specialty pharmacy services due to a pharmacy change. The patient prefers to have his Biktarvy filled at the [facility] on [address] in [place], [place].    We can re-enroll in the future if needed.    Additional information provided to the patient: n/a    Roger Bowers, PharmD  [facility] Specialty Pharmacist

## 2021-12-16 NOTE — Unmapped (Signed)
Roger Bowers has been contacted in regards to their refill of Biktarvy. At this time, they have declined refill due to wanting to change to [facility] on [address] in [place]. Refill assessment call date has been updated per the patient's request.

## 2021-12-19 DIAGNOSIS — F4312 Post-traumatic stress disorder, chronic: Principal | ICD-10-CM

## 2021-12-19 MED ORDER — CITALOPRAM 40 MG TABLET
ORAL_TABLET | Freq: Every day | ORAL | 11 refills | 30 days | Status: CP
Start: 2021-12-19 — End: ?

## 2022-01-13 ENCOUNTER — Ambulatory Visit: Admit: 2022-01-13 | Payer: MEDICARE | Attending: Infectious Disease | Primary: Infectious Disease

## 2022-01-28 ENCOUNTER — Ambulatory Visit: Admit: 2022-01-28 | Payer: MEDICARE | Attending: Family | Primary: Family

## 2022-06-12 ENCOUNTER — Ambulatory Visit: Admit: 2022-06-12 | Discharge: 2022-06-13 | Payer: MEDICARE | Attending: Family | Primary: Family

## 2022-06-14 ENCOUNTER — Ambulatory Visit: Admit: 2022-06-14 | Discharge: 2022-06-15 | Payer: MEDICARE

## 2022-06-14 DIAGNOSIS — Z111 Encounter for screening for respiratory tuberculosis: Principal | ICD-10-CM

## 2022-07-07 ENCOUNTER — Ambulatory Visit
Admit: 2022-07-07 | Discharge: 2022-07-08 | Payer: MEDICARE | Attending: Infectious Disease | Primary: Infectious Disease

## 2022-07-07 DIAGNOSIS — Z658 Other specified problems related to psychosocial circumstances: Principal | ICD-10-CM

## 2022-07-07 DIAGNOSIS — F4312 Post-traumatic stress disorder, chronic: Principal | ICD-10-CM

## 2022-07-07 DIAGNOSIS — Z23 Encounter for immunization: Principal | ICD-10-CM

## 2022-07-07 DIAGNOSIS — Z113 Encounter for screening for infections with a predominantly sexual mode of transmission: Principal | ICD-10-CM

## 2022-07-07 DIAGNOSIS — B20 Human immunodeficiency virus [HIV] disease: Principal | ICD-10-CM

## 2022-07-07 DIAGNOSIS — Z1633 Resistance to antiviral drug(s): Principal | ICD-10-CM

## 2022-07-07 DIAGNOSIS — F341 Dysthymic disorder: Principal | ICD-10-CM

## 2022-07-07 DIAGNOSIS — B349 Viral infection, unspecified: Principal | ICD-10-CM

## 2022-07-07 MED ORDER — BICTEGRAVIR 50 MG-EMTRICITABINE 200 MG-TENOFOVIR ALAFENAM 25 MG TABLET
ORAL_TABLET | Freq: Every day | ORAL | 11 refills | 30 days | Status: CP
Start: 2022-07-07 — End: ?

## 2022-07-07 MED ORDER — CITALOPRAM 40 MG TABLET
ORAL_TABLET | Freq: Every day | ORAL | 11 refills | 30 days | Status: CP
Start: 2022-07-07 — End: ?

## 2022-07-10 DIAGNOSIS — A749 Chlamydial infection, unspecified: Principal | ICD-10-CM

## 2022-07-10 MED ORDER — DOXYCYCLINE MONOHYDRATE 100 MG CAPSULE
ORAL_CAPSULE | Freq: Two times a day (BID) | ORAL | 0 refills | 7 days | Status: CP
Start: 2022-07-10 — End: 2022-07-17

## 2022-07-17 DIAGNOSIS — A749 Chlamydial infection, unspecified: Principal | ICD-10-CM

## 2022-07-17 MED ORDER — DOXYCYCLINE MONOHYDRATE 100 MG CAPSULE
ORAL_CAPSULE | Freq: Two times a day (BID) | ORAL | 0 refills | 7 days | Status: CP
Start: 2022-07-17 — End: 2022-07-24

## 2022-07-18 ENCOUNTER — Ambulatory Visit
Admit: 2022-07-18 | Discharge: 2022-07-22 | Disposition: A | Discharge status: Home / Self Care | Payer: MEDICARE | Admitting: Internal Medicine

## 2022-07-22 MED ORDER — OXYCODONE 5 MG TABLET
ORAL_TABLET | ORAL | 0 refills | 2 days | Status: CP | PRN
Start: 2022-07-22 — End: 2022-07-27

## 2022-07-22 MED ORDER — DOXYCYCLINE HYCLATE 100 MG TABLET
ORAL_TABLET | Freq: Two times a day (BID) | ORAL | 0 refills | 17 days | Status: CP
Start: 2022-07-22 — End: 2022-08-07

## 2022-07-23 DIAGNOSIS — Z09 Encounter for follow-up examination after completed treatment for conditions other than malignant neoplasm: Principal | ICD-10-CM

## 2022-07-23 MED ORDER — AZITHROMYCIN 500 MG TABLET
ORAL_TABLET | Freq: Once | ORAL | 0 refills | 1.00000 days | Status: CP
Start: 2022-07-23 — End: 2022-07-23

## 2022-07-30 ENCOUNTER — Ambulatory Visit: Admit: 2022-07-30 | Discharge: 2022-07-31 | Payer: MEDICARE | Attending: Internal Medicine | Primary: Internal Medicine

## 2022-07-30 DIAGNOSIS — B20 Human immunodeficiency virus [HIV] disease: Principal | ICD-10-CM

## 2022-07-30 DIAGNOSIS — F4312 Post-traumatic stress disorder, chronic: Principal | ICD-10-CM

## 2022-07-30 DIAGNOSIS — Z8619 Personal history of other infectious and parasitic diseases: Principal | ICD-10-CM

## 2022-07-30 DIAGNOSIS — Z09 Encounter for follow-up examination after completed treatment for conditions other than malignant neoplasm: Principal | ICD-10-CM

## 2022-07-30 DIAGNOSIS — N179 Acute kidney failure, unspecified: Principal | ICD-10-CM

## 2022-07-30 DIAGNOSIS — A039 Shigellosis, unspecified: Principal | ICD-10-CM

## 2022-07-30 DIAGNOSIS — A563 Chlamydial infection of anus and rectum: Principal | ICD-10-CM

## 2022-07-30 DIAGNOSIS — E871 Hypo-osmolality and hyponatremia: Principal | ICD-10-CM

## 2022-08-13 ENCOUNTER — Ambulatory Visit: Admit: 2022-08-13 | Discharge: 2022-08-14 | Payer: MEDICARE

## 2022-08-13 DIAGNOSIS — A563 Chlamydial infection of anus and rectum: Principal | ICD-10-CM

## 2022-08-13 DIAGNOSIS — A039 Shigellosis, unspecified: Principal | ICD-10-CM

## 2022-08-13 DIAGNOSIS — Z113 Encounter for screening for infections with a predominantly sexual mode of transmission: Principal | ICD-10-CM

## 2022-08-13 DIAGNOSIS — B20 Human immunodeficiency virus [HIV] disease: Principal | ICD-10-CM

## 2022-09-14 ENCOUNTER — Ambulatory Visit
Admit: 2022-09-14 | Discharge: 2022-09-14 | Disposition: A | Discharge status: Home / Self Care | Payer: MEDICARE | Attending: Family

## 2022-09-14 DIAGNOSIS — H1031 Unspecified acute conjunctivitis, right eye: Principal | ICD-10-CM

## 2022-09-14 MED ORDER — POLYMYXIN B SULFATE 10,000 UNIT-TRIMETHOPRIM 1 MG/ML EYE DROPS
Freq: Four times a day (QID) | OPHTHALMIC | 0 refills | 50 days | Status: CP
Start: 2022-09-14 — End: 2022-09-21

## 2022-09-16 ENCOUNTER — Ambulatory Visit: Admit: 2022-09-16 | Discharge: 2022-09-16 | Disposition: A | Discharge status: Home / Self Care | Payer: MEDICARE

## 2022-09-16 MED ORDER — ONDANSETRON 4 MG DISINTEGRATING TABLET
ORAL_TABLET | Freq: Three times a day (TID) | ORAL | 0 refills | 7 days | Status: CP | PRN
Start: 2022-09-16 — End: 2022-09-23

## 2022-10-27 ENCOUNTER — Ambulatory Visit: Admit: 2022-10-27 | Payer: MEDICARE | Attending: Infectious Disease | Primary: Infectious Disease

## 2022-12-08 ENCOUNTER — Ambulatory Visit: Admit: 2022-12-08 | Discharge: 2022-12-08 | Payer: MEDICARE

## 2022-12-08 DIAGNOSIS — Z8619 Personal history of other infectious and parasitic diseases: Principal | ICD-10-CM

## 2022-12-08 DIAGNOSIS — Z113 Encounter for screening for infections with a predominantly sexual mode of transmission: Principal | ICD-10-CM

## 2022-12-08 DIAGNOSIS — F341 Dysthymic disorder: Principal | ICD-10-CM

## 2022-12-08 DIAGNOSIS — Z1159 Encounter for screening for other viral diseases: Principal | ICD-10-CM

## 2022-12-08 DIAGNOSIS — F4312 Post-traumatic stress disorder, chronic: Principal | ICD-10-CM

## 2022-12-08 DIAGNOSIS — Z5181 Encounter for therapeutic drug level monitoring: Principal | ICD-10-CM

## 2022-12-08 DIAGNOSIS — Z79899 Other long term (current) drug therapy: Principal | ICD-10-CM

## 2022-12-08 DIAGNOSIS — B20 Human immunodeficiency virus [HIV] disease: Principal | ICD-10-CM

## 2022-12-08 DIAGNOSIS — Z9189 Other specified personal risk factors, not elsewhere classified: Principal | ICD-10-CM

## 2022-12-08 DIAGNOSIS — F1920 Other psychoactive substance dependence, uncomplicated: Principal | ICD-10-CM

## 2022-12-08 DIAGNOSIS — Z23 Encounter for immunization: Principal | ICD-10-CM

## 2022-12-08 MED ORDER — BICTEGRAVIR 50 MG-EMTRICITABINE 200 MG-TENOFOVIR ALAFENAM 25 MG TABLET
ORAL_TABLET | Freq: Every day | ORAL | 0 refills | 30 days | Status: CP
Start: 2022-12-08 — End: ?

## 2022-12-08 MED ORDER — CITALOPRAM 40 MG TABLET
ORAL_TABLET | Freq: Every day | ORAL | 0 refills | 30 days | Status: CP
Start: 2022-12-08 — End: ?

## 2022-12-10 DIAGNOSIS — B20 Human immunodeficiency virus [HIV] disease: Principal | ICD-10-CM

## 2022-12-11 ENCOUNTER — Institutional Professional Consult (permissible substitution): Admit: 2022-12-11 | Discharge: 2022-12-12 | Payer: MEDICARE

## 2022-12-11 ENCOUNTER — Ambulatory Visit: Admit: 2022-12-11 | Discharge: 2022-12-12 | Payer: MEDICARE

## 2022-12-11 DIAGNOSIS — Z23 Encounter for immunization: Principal | ICD-10-CM

## 2023-01-04 DIAGNOSIS — B20 Human immunodeficiency virus [HIV] disease: Principal | ICD-10-CM

## 2023-01-04 MED ORDER — BIKTARVY 50 MG-200 MG-25 MG TABLET
ORAL_TABLET | Freq: Every day | ORAL | 0 refills | 0 days
Start: 2023-01-04 — End: ?

## 2023-01-05 MED ORDER — BIKTARVY 50 MG-200 MG-25 MG TABLET
ORAL_TABLET | Freq: Every day | ORAL | 0 refills | 30 days | Status: CP
Start: 2023-01-05 — End: ?

## 2023-01-08 DIAGNOSIS — F341 Dysthymic disorder: Principal | ICD-10-CM

## 2023-01-08 DIAGNOSIS — F4312 Post-traumatic stress disorder, chronic: Principal | ICD-10-CM

## 2023-01-08 MED ORDER — CITALOPRAM 40 MG TABLET
ORAL_TABLET | Freq: Every day | ORAL | 0 refills | 30 days | Status: CP
Start: 2023-01-08 — End: 2023-01-08

## 2023-01-26 ENCOUNTER — Ambulatory Visit: Admit: 2023-01-26 | Payer: MEDICARE

## 2023-03-30 ENCOUNTER — Ambulatory Visit: Admit: 2023-03-30 | Discharge: 2023-03-31 | Payer: MEDICARE

## 2023-03-30 DIAGNOSIS — Z1159 Encounter for screening for other viral diseases: Principal | ICD-10-CM

## 2023-03-30 DIAGNOSIS — B20 Human immunodeficiency virus [HIV] disease: Principal | ICD-10-CM

## 2023-03-30 DIAGNOSIS — K121 Other forms of stomatitis: Principal | ICD-10-CM

## 2023-03-30 DIAGNOSIS — F341 Dysthymic disorder: Principal | ICD-10-CM

## 2023-03-30 DIAGNOSIS — Z113 Encounter for screening for infections with a predominantly sexual mode of transmission: Principal | ICD-10-CM

## 2023-03-30 DIAGNOSIS — Z9189 Other specified personal risk factors, not elsewhere classified: Principal | ICD-10-CM

## 2023-03-30 DIAGNOSIS — Z5181 Encounter for therapeutic drug level monitoring: Principal | ICD-10-CM

## 2023-03-30 DIAGNOSIS — Z1633 Resistance to antiviral drug(s): Principal | ICD-10-CM

## 2023-03-30 DIAGNOSIS — B349 Viral infection, unspecified: Principal | ICD-10-CM

## 2023-03-30 DIAGNOSIS — Z8619 Personal history of other infectious and parasitic diseases: Principal | ICD-10-CM

## 2023-03-30 DIAGNOSIS — Z79899 Other long term (current) drug therapy: Principal | ICD-10-CM

## 2023-03-30 DIAGNOSIS — H9192 Unspecified hearing loss, left ear: Principal | ICD-10-CM

## 2023-03-30 DIAGNOSIS — Z7289 Other problems related to lifestyle: Principal | ICD-10-CM

## 2023-03-30 DIAGNOSIS — F4312 Post-traumatic stress disorder, chronic: Principal | ICD-10-CM

## 2023-03-30 MED ORDER — CITALOPRAM 40 MG TABLET
ORAL_TABLET | Freq: Every day | ORAL | 0 refills | 30.00 days | Status: CN
Start: 2023-03-30 — End: ?

## 2023-03-30 MED ORDER — VALACYCLOVIR 1 GRAM TABLET
ORAL_TABLET | Freq: Two times a day (BID) | ORAL | 0 refills | 10.00 days | Status: CP
Start: 2023-03-30 — End: 2023-04-09

## 2023-03-30 MED ORDER — BIKTARVY 50 MG-200 MG-25 MG TABLET
ORAL_TABLET | Freq: Every day | ORAL | 0 refills | 30.00 days | Status: CP
Start: 2023-03-30 — End: ?

## 2023-03-31 DIAGNOSIS — B20 Human immunodeficiency virus [HIV] disease: Principal | ICD-10-CM

## 2023-04-01 DIAGNOSIS — K121 Other forms of stomatitis: Principal | ICD-10-CM

## 2023-04-06 DIAGNOSIS — H833X1 Noise effects on right inner ear: Principal | ICD-10-CM

## 2023-04-07 ENCOUNTER — Ambulatory Visit: Admit: 2023-04-07 | Payer: MEDICARE

## 2023-04-07 ENCOUNTER — Ambulatory Visit: Admit: 2023-04-07 | Payer: MEDICARE | Attending: Audiologist | Primary: Audiologist

## 2023-04-20 ENCOUNTER — Ambulatory Visit
Admit: 2023-04-20 | Discharge: 2023-04-21 | Payer: MEDICARE | Attending: Infectious Disease | Primary: Infectious Disease

## 2023-04-20 DIAGNOSIS — F4312 Post-traumatic stress disorder, chronic: Principal | ICD-10-CM

## 2023-04-20 DIAGNOSIS — Z1633 Resistance to antiviral drug(s): Principal | ICD-10-CM

## 2023-04-20 DIAGNOSIS — B20 Human immunodeficiency virus [HIV] disease: Principal | ICD-10-CM

## 2023-04-20 DIAGNOSIS — F341 Dysthymic disorder: Principal | ICD-10-CM

## 2023-04-20 DIAGNOSIS — R058 Post-viral cough syndrome: Principal | ICD-10-CM

## 2023-04-20 DIAGNOSIS — B349 Viral infection, unspecified: Principal | ICD-10-CM

## 2023-04-20 MED ORDER — DEXTROMETHORPHAN-GUAIFENESIN 10 MG-100 MG/5 ML ORAL LIQUID
ORAL | 0 refills | 4.00 days | Status: CP | PRN
Start: 2023-04-20 — End: ?

## 2023-04-20 MED ORDER — FLUTICASONE PROPIONATE 50 MCG/ACTUATION NASAL SPRAY,SUSPENSION
Freq: Every day | NASAL | 0 refills | 120.00 days | Status: CP
Start: 2023-04-20 — End: 2024-04-19

## 2023-04-20 MED ORDER — CITALOPRAM 40 MG TABLET
ORAL_TABLET | Freq: Every day | ORAL | 0 refills | 60.00 days | Status: CP
Start: 2023-04-20 — End: ?

## 2023-04-26 DIAGNOSIS — B20 Human immunodeficiency virus [HIV] disease: Principal | ICD-10-CM

## 2023-04-26 MED ORDER — BIKTARVY 50 MG-200 MG-25 MG TABLET
ORAL_TABLET | Freq: Every day | ORAL | 0 refills | 0.00 days
Start: 2023-04-26 — End: ?

## 2023-04-27 MED ORDER — BIKTARVY 50 MG-200 MG-25 MG TABLET
ORAL_TABLET | Freq: Every day | ORAL | 0 refills | 30.00 days | Status: CP
Start: 2023-04-27 — End: ?

## 2023-06-23 DIAGNOSIS — F4312 Post-traumatic stress disorder, chronic: Principal | ICD-10-CM

## 2023-06-23 DIAGNOSIS — F341 Dysthymic disorder: Principal | ICD-10-CM

## 2023-06-23 MED ORDER — CITALOPRAM 40 MG TABLET
ORAL_TABLET | Freq: Every day | ORAL | 0 refills | 60.00000 days | Status: CP
Start: 2023-06-23 — End: ?

## 2023-06-26 DIAGNOSIS — R058 Post-viral cough syndrome: Principal | ICD-10-CM

## 2023-06-26 MED ORDER — FLUTICASONE PROPIONATE 50 MCG/ACTUATION NASAL SPRAY,SUSPENSION
Freq: Every day | NASAL | 0 refills | 120.00000 days | Status: CP
Start: 2023-06-26 — End: 2024-06-25

## 2023-07-06 ENCOUNTER — Ambulatory Visit: Admit: 2023-07-06 | Payer: Medicare (Managed Care) | Attending: Infectious Disease | Primary: Infectious Disease

## 2023-07-13 IMAGING — CR DG ABDOMEN 1V
3 series · 3 of 3 positions shown · non-contrast
Comparison: None.

CLINICAL DATA: Constipated

EXAM:
ABDOMEN - 1 VIEW

[abdomen kub (1 of 3)]
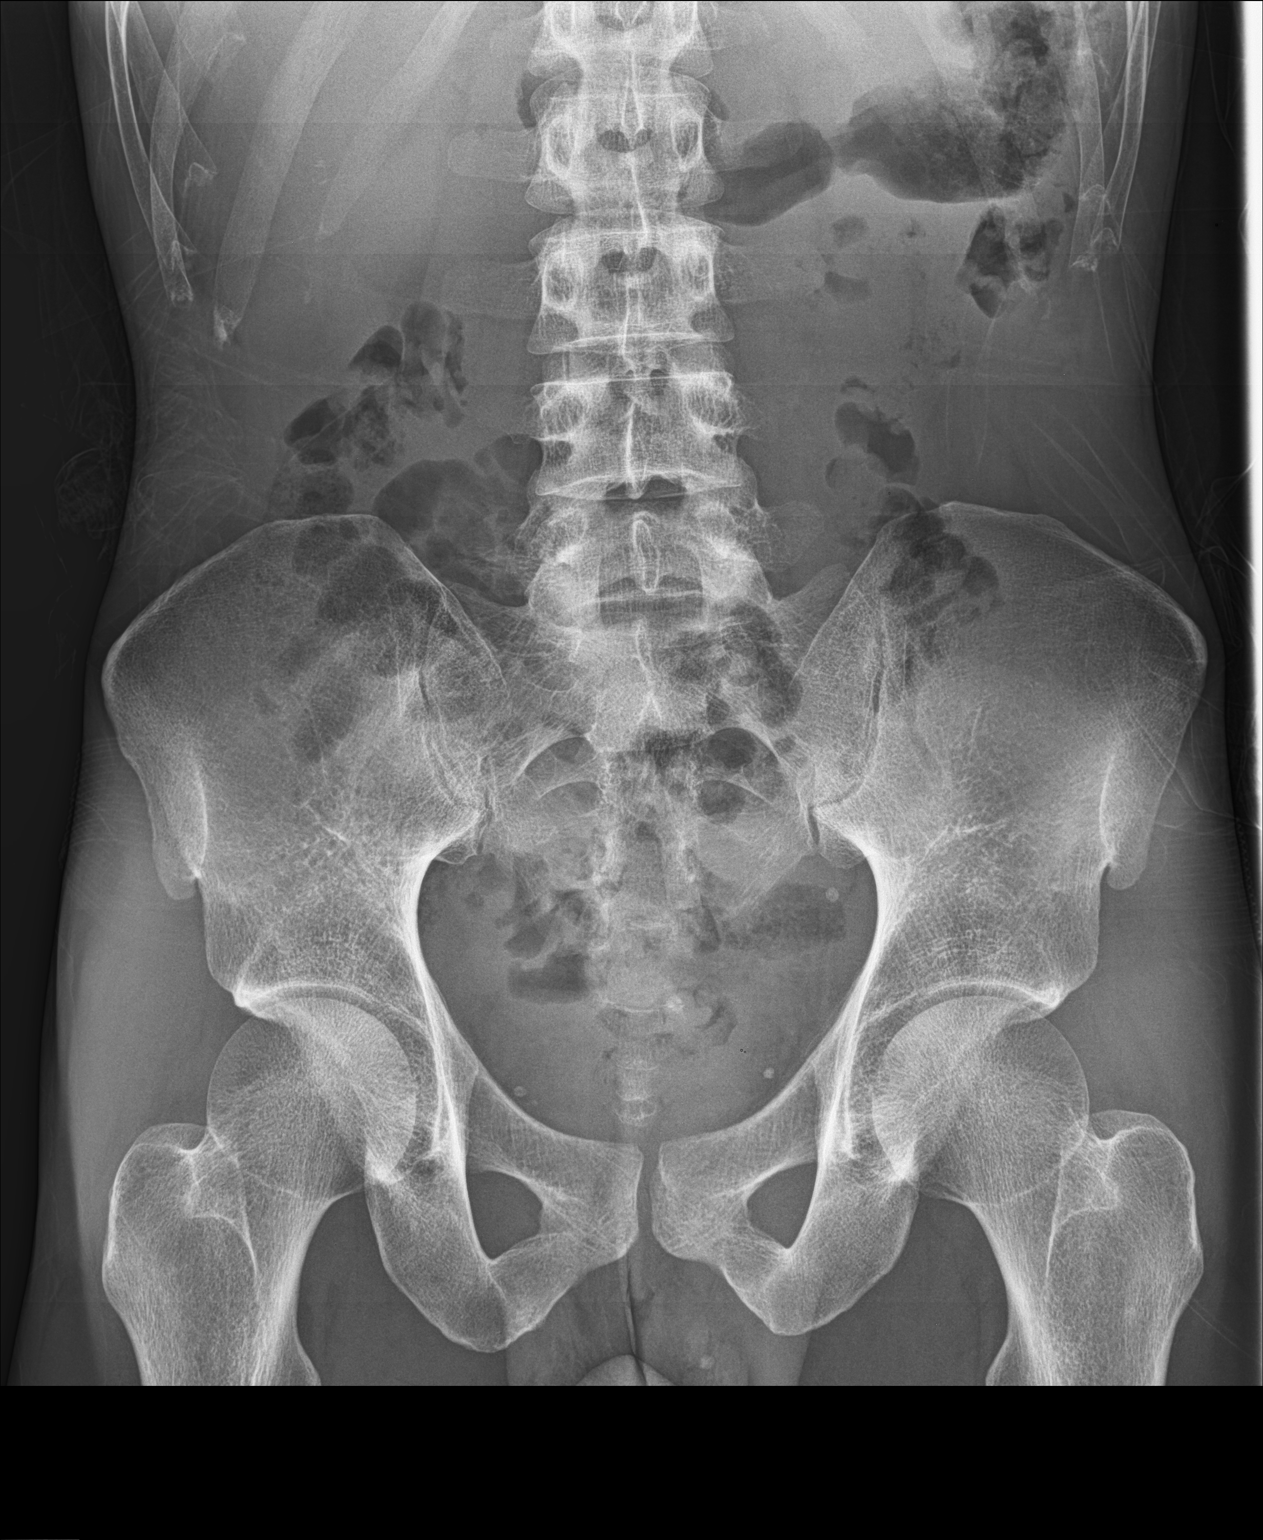

[abdomen kub (2 of 3)]
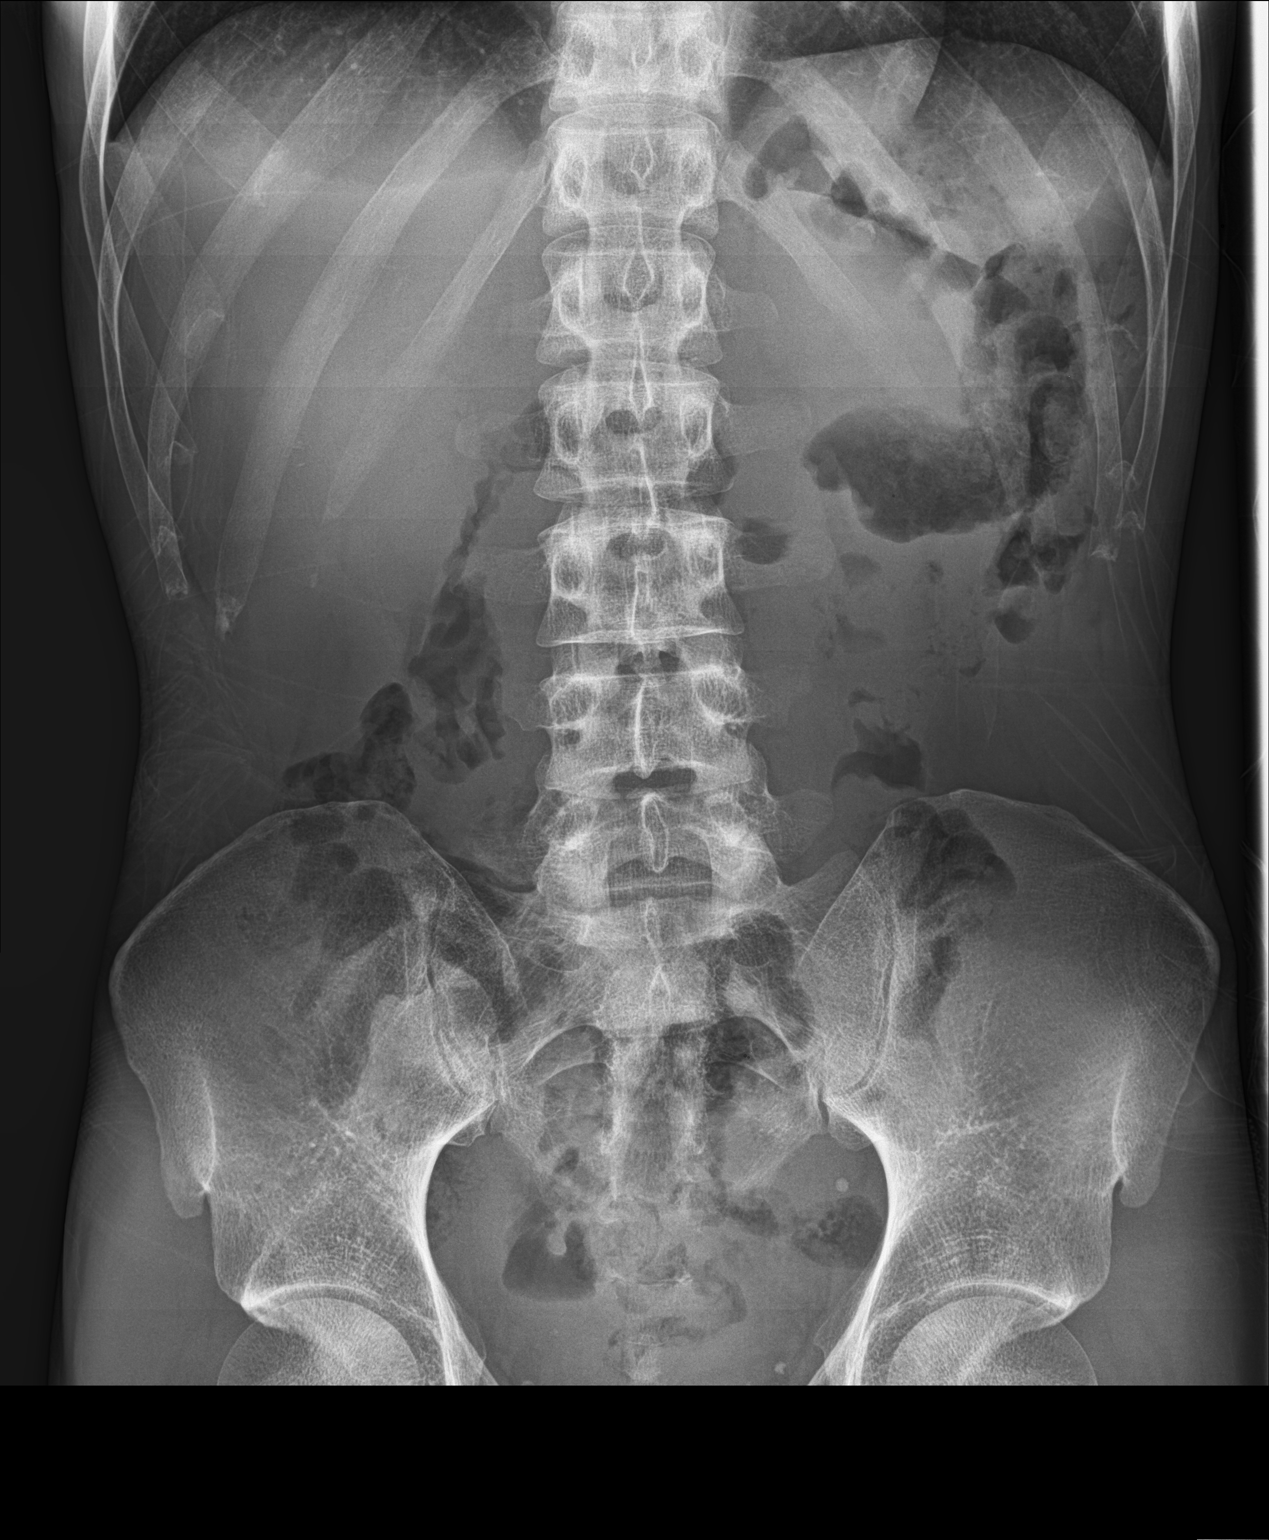

[abdomen kub (3 of 3)]
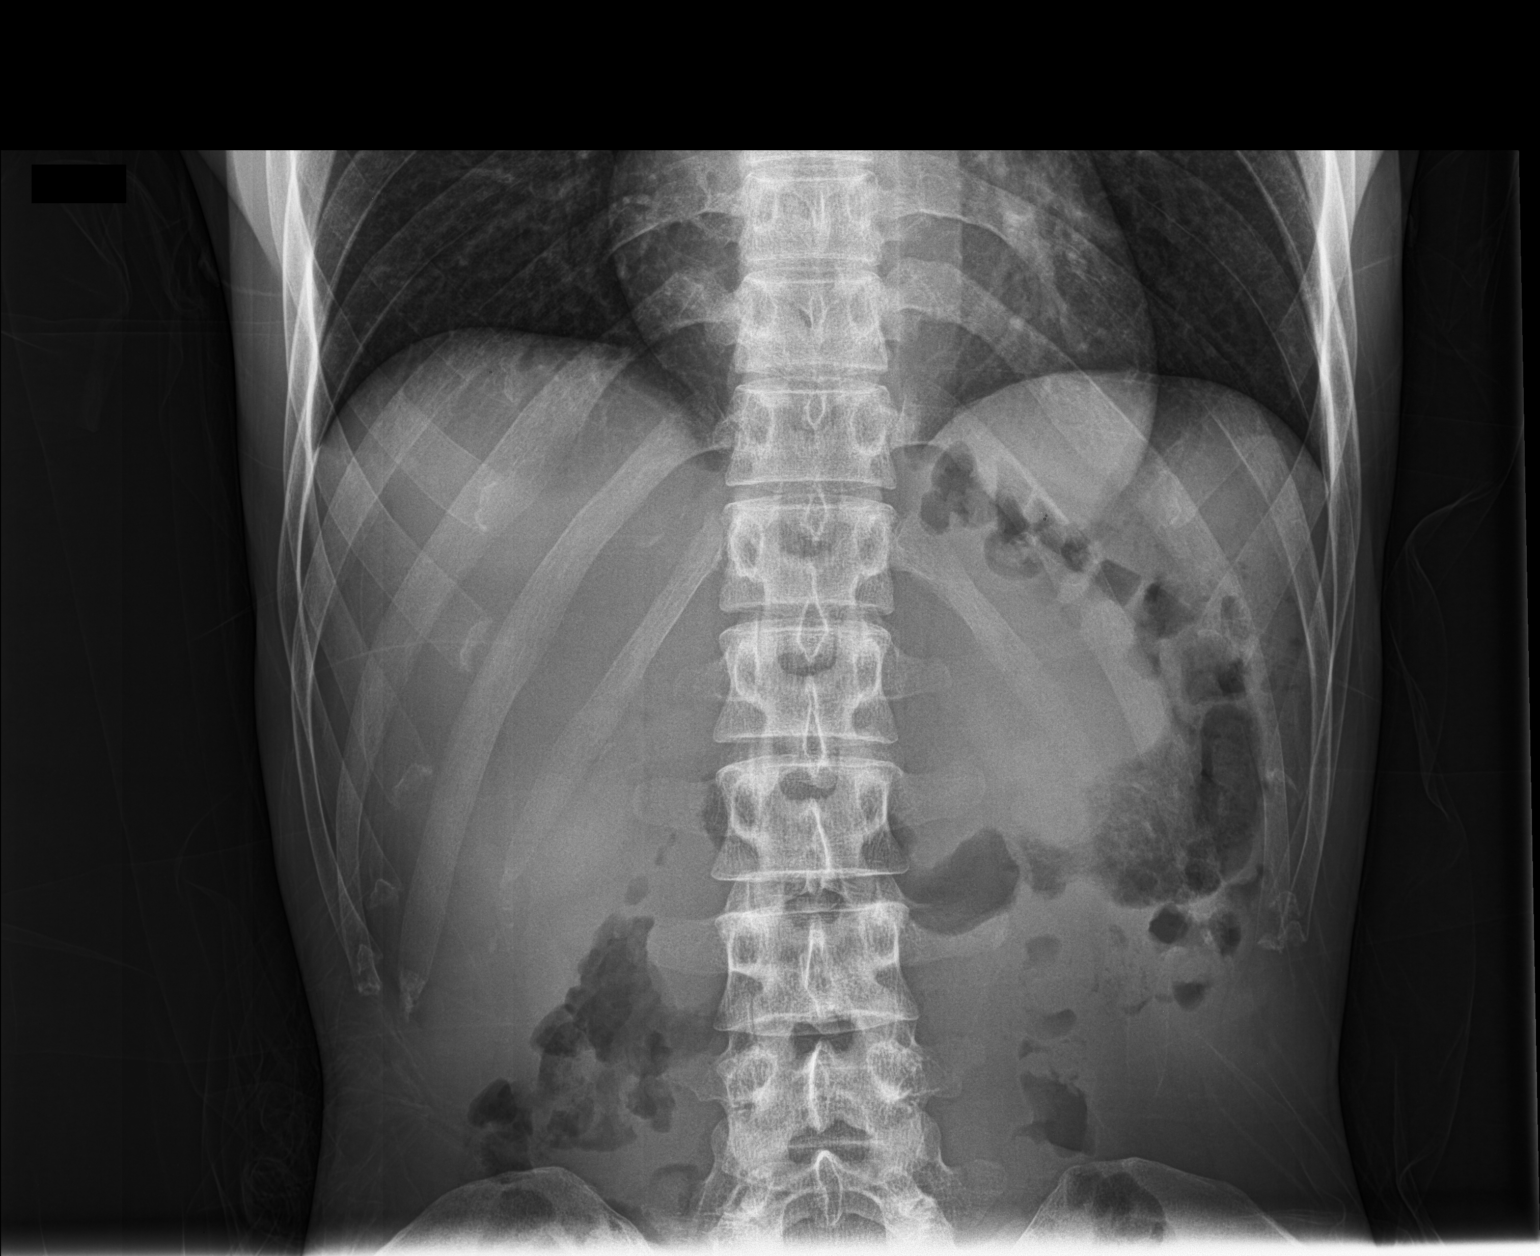

[3 of 3 positions shown; findings below may reference images not displayed]

FINDINGS: The bowel gas pattern is normal. No radio-opaque calculi or other
significant radiographic abnormality are seen. Mild stool in the
colon. Phleboliths in the pelvis.
IMPRESSION: Negative.  Mild stool burden

## 2023-08-26 DIAGNOSIS — F341 Dysthymic disorder: Principal | ICD-10-CM

## 2023-08-26 DIAGNOSIS — F4312 Post-traumatic stress disorder, chronic: Principal | ICD-10-CM

## 2023-08-26 MED ORDER — CITALOPRAM 40 MG TABLET
ORAL_TABLET | Freq: Every day | ORAL | 0 refills | 60.00000 days | Status: CP
Start: 2023-08-26 — End: ?

## 2023-08-28 DIAGNOSIS — R058 Post-viral cough syndrome: Principal | ICD-10-CM

## 2023-08-28 MED ORDER — FLUTICASONE PROPIONATE 50 MCG/ACTUATION NASAL SPRAY,SUSPENSION
Freq: Every day | NASAL | 0 refills | 120.00000 days | Status: CP
Start: 2023-08-28 — End: 2024-08-27

## 2023-08-31 ENCOUNTER — Encounter
Admit: 2023-08-31 | Discharge: 2023-08-31 | Payer: Medicare (Managed Care) | Attending: Infectious Disease | Primary: Infectious Disease

## 2023-08-31 DIAGNOSIS — B20 Human immunodeficiency virus [HIV] disease: Principal | ICD-10-CM

## 2023-08-31 DIAGNOSIS — R058 Post-viral cough syndrome: Principal | ICD-10-CM

## 2023-08-31 DIAGNOSIS — F4312 Post-traumatic stress disorder, chronic: Principal | ICD-10-CM

## 2023-08-31 DIAGNOSIS — Z79899 Other long term (current) drug therapy: Principal | ICD-10-CM

## 2023-08-31 DIAGNOSIS — Z9189 Other specified personal risk factors, not elsewhere classified: Principal | ICD-10-CM

## 2023-08-31 DIAGNOSIS — Z113 Encounter for screening for infections with a predominantly sexual mode of transmission: Principal | ICD-10-CM

## 2023-08-31 DIAGNOSIS — Z5181 Encounter for therapeutic drug level monitoring: Principal | ICD-10-CM

## 2023-08-31 DIAGNOSIS — F341 Dysthymic disorder: Principal | ICD-10-CM

## 2023-08-31 DIAGNOSIS — Z8619 Personal history of other infectious and parasitic diseases: Principal | ICD-10-CM

## 2023-08-31 LAB — CBC W/ AUTO DIFF
BASOPHILS ABSOLUTE COUNT: 0 10*9/L (ref 0.0–0.1)
BASOPHILS RELATIVE PERCENT: 0.3 %
EOSINOPHILS ABSOLUTE COUNT: 0.2 10*9/L (ref 0.0–0.5)
EOSINOPHILS RELATIVE PERCENT: 3.5 %
HEMATOCRIT: 44.2 % (ref 39.0–48.0)
HEMOGLOBIN: 14.4 g/dL (ref 12.9–16.5)
LYMPHOCYTES ABSOLUTE COUNT: 2.7 10*9/L (ref 1.1–3.6)
LYMPHOCYTES RELATIVE PERCENT: 43.6 %
MEAN CORPUSCULAR HEMOGLOBIN CONC: 32.5 g/dL (ref 32.0–36.0)
MEAN CORPUSCULAR HEMOGLOBIN: 26.5 pg (ref 25.9–32.4)
MEAN CORPUSCULAR VOLUME: 81.4 fL (ref 77.6–95.7)
MEAN PLATELET VOLUME: 7.4 fL (ref 6.8–10.7)
MONOCYTES ABSOLUTE COUNT: 0.6 10*9/L (ref 0.3–0.8)
MONOCYTES RELATIVE PERCENT: 9.4 %
NEUTROPHILS ABSOLUTE COUNT: 2.7 10*9/L (ref 1.8–7.8)
NEUTROPHILS RELATIVE PERCENT: 43.2 %
PLATELET COUNT: 206 10*9/L (ref 150–450)
RED BLOOD CELL COUNT: 5.43 10*12/L (ref 4.26–5.60)
RED CELL DISTRIBUTION WIDTH: 13.9 % (ref 12.2–15.2)
WBC ADJUSTED: 6.2 10*9/L (ref 3.6–11.2)

## 2023-08-31 LAB — BASIC METABOLIC PANEL
ANION GAP: 7 mmol/L (ref 5–14)
BLOOD UREA NITROGEN: 15 mg/dL (ref 9–23)
BUN / CREAT RATIO: 15
CALCIUM: 9.1 mg/dL (ref 8.7–10.4)
CHLORIDE: 102 mmol/L (ref 98–107)
CO2: 31.1 mmol/L — ABNORMAL HIGH (ref 20.0–31.0)
CREATININE: 1.02 mg/dL (ref 0.73–1.18)
EGFR CKD-EPI (2021) MALE: >90 mL/min/1.73m2 (ref >=60)
GLUCOSE RANDOM: 90 mg/dL (ref 70–179)
POTASSIUM: 4.2 mmol/L (ref 3.4–4.8)
SODIUM: 140 mmol/L (ref 135–145)

## 2023-08-31 LAB — AST: AST (SGOT): 22 U/L (ref <=34)

## 2023-08-31 LAB — BILIRUBIN, TOTAL: BILIRUBIN TOTAL: 0.5 mg/dL (ref 0.3–1.2)

## 2023-08-31 LAB — ALT: ALT (SGPT): 21 U/L (ref 10–49)

## 2023-08-31 LAB — HIV RNA, QUANTITATIVE, PCR
HIV RNA QNT RSLT: DETECTED — AB
HIV RNA: <20 {copies}/mL — ABNORMAL HIGH (ref <0)

## 2023-08-31 MED ORDER — FLUTICASONE PROPIONATE 50 MCG/ACTUATION NASAL SPRAY,SUSPENSION
Freq: Every day | NASAL | 2 refills | 120.00000 days | Status: CP
Start: 2023-08-31 — End: 2024-08-30

## 2023-08-31 MED ORDER — BIKTARVY 50 MG-200 MG-25 MG TABLET
ORAL_TABLET | Freq: Every day | ORAL | 5 refills | 30.00000 days | Status: CP
Start: 2023-08-31 — End: ?
  Filled 2023-09-02: qty 30, 30d supply, fill #0

## 2023-08-31 MED ORDER — CITALOPRAM 40 MG TABLET
ORAL_TABLET | Freq: Every day | ORAL | 11 refills | 30.00000 days | Status: CP
Start: 2023-08-31 — End: ?

## 2023-08-31 NOTE — Unmapped (Signed)
 Medical Case Management Social Work Note    Duration of Intervention: 30 minutes    TYPE OF CONTACT: Face to Face - In Person    ASSESSMENT: Patient was seen in clinic for a follow-up medical appointment. The patient stated he wanted to speak with SW regarding housing.     INTERVENTION:  Patient has was previously referred to [facility] and [facility] SW attempted to contact the [facility] intake coordinator prior to meeting with the patient. The coordinator was unavailable. SW left a message for a return call. SW also contacted     SW spoke with [place], Intake SW for [facility]. She explained that the patient could retake the test for housing; however, he would score lower. The list is primarily based on the scoring system. She also stated the patient could attempt to use [employer] in [place].        PLAN:  SW attempted to contact the patient after he left the clinic, but he did not answer. SW will try again at a later time.     UPDATE: SW spoke with the patient in detail and explain the information provided by [place]. The patient placed the intake number within his cellphone and stated he would follow-up. The patient verbalized that he understood he is still on the housing list and would not be removed until he communicates that he has housing.        [name], LCSW-A  [facility] Social Work

## 2023-08-31 NOTE — Unmapped (Signed)
 Pt. Reports some missed doses of Biktarvy  over the last month

## 2023-08-31 NOTE — Unmapped (Signed)
 [facility] Specialty Medication Onboarding    Specialty Medication: BIKTARVY  50-200-25 mg tablet (bictegrav-emtricit-tenofov ala)  Prior Authorization: Not Required   Financial Assistance: No - copay  <$25  Final Copay/Day Supply: $0 / 30    Insurance Restrictions: None     Notes to Pharmacist:   Credit Card on File: no  Start Date on Rx:      The triage team has completed the benefits investigation and has determined that the patient is able to fill this medication at [facility]. Please contact the patient to complete the onboarding or follow up with the prescribing physician as needed.
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
## 2023-09-01 LAB — RPR, THERAPY MONITORING: SYPHILIS RPR SCREEN: NONREACTIVE

## 2023-09-01 NOTE — Unmapped (Addendum)
 RN Follow up Call:  Tried to return patients call. It went to voice mail and the voicemail is not set up.    ---------------------------------------------------------------------------------------------        ----- Message from Rosina SQUIBB sent at 09/01/2023  4:03 PM EDT -----  Incoming Call  Caller: Rosaria Darilyn Gaskins  Best callback number: [phone]  Reason for call: Has a question about his test results please call patient back         Thank you!
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
## 2023-09-02 NOTE — Unmapped (Signed)
 Roger Bowers returned my call regarding lab results.   We also discussed his starting Cabenuva in the near future. Dicussed the importance on coming to appts and he is well versed in using bus transportation.   Will let provider know of discussion.

## 2023-09-03 DIAGNOSIS — A749 Chlamydial infection, unspecified: Principal | ICD-10-CM

## 2023-09-03 MED ORDER — DOXYCYCLINE MONOHYDRATE 100 MG CAPSULE
ORAL_CAPSULE | Freq: Two times a day (BID) | ORAL | 0 refills | 7.00000 days | Status: CP
Start: 2023-09-03 — End: 2023-09-10

## 2023-09-03 NOTE — Unmapped (Signed)
 Patient contacted clinic regarding positive chlamydia test and VL result.    Per his request, send script for doxycyline 100mg  bid for 7 days to requested pharmacy.    Pt also discussed cabenuva with a nurse and remains interested, will pursue in followup

## 2023-09-03 NOTE — Unmapped (Signed)
 Called to let Roger Bowers know the Doxycycline  prescription was sent in by Dr.Gay. Stated understanding.

## 2023-09-08 NOTE — Unmapped (Signed)
 Medical Case Management Social Work Note    Duration of Intervention: 15 minutes    TYPE OF CONTACT: Phone and E-mail    ASSESSMENT: Patient contacted SW requesting assistance with obtaining documentation.     INTERVENTION: SW spoke with the patient in detail. He explained that he spoke with Tobias from [place] and was told to submit a verification of status. SW informed the patient that the requested information would be faxed to the [employer]. The patient requested that the information be emailed to him. He states that he prefers to provide the information along with his application. The patient verified he wanted the information sent to [email or web address].     PLAN: SW informed the patient that the HIV RNA labwork from 08/31/2023 would be sent to his email.     SW spoke with [place]. SW was informed that the patient was redirected to [facility] [email or web address]). Patient confirmed that he received the lab information.     [name], LCSW-A  [facility] Social Work
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
## 2023-09-28 MED FILL — BIKTARVY 50 MG-200 MG-25 MG TABLET: ORAL | 30 days supply | Qty: 30 | Fill #1

## 2023-10-01 NOTE — Unmapped (Signed)
 Abstraction Result Flowsheet Data    This patient's last AWV date: : Not Found  This patients last WCC/CPE date: : 06/14/2022      Reason for Encounter  Reason for Encounter: Outreach  Primary Reason for Outreach: CCOO+  Text Message: No  MyChart Message: No  Outreach Call Outcome: Left Message

## 2023-10-01 NOTE — Unmapped (Signed)
 Abstraction Result Flowsheet Data    This patient's last AWV date: : Not Found  This patients last WCC/CPE date: : 06/14/2022      Reason for Encounter  Reason for Encounter: Outreach  Primary Reason for Outreach: CCOO+  Text Message: No  MyChart Message: No  Outreach Call Outcome: Scheduled                                      Patient is scheduled for return visit with PCP on 8/18 at 8:30am

## 2023-10-21 NOTE — Unmapped (Signed)
Medical Case Management Attempt Social Work Note     Duration of Intervention: 5 minutes    REASON/TYPE OF CONTACT: Phone    INTERVENTION:  SW attempted to contact for services but no answer. This SW left a discreet VM requesting a return phone call.    PLAN:  SW will try to contact again at a later date.    Sofie Rower, LCSW-A  [facility] Social Work
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
## 2023-10-23 NOTE — Unmapped (Signed)
 Social worker reached out to nursing stating patient was concerned about some possible STI concerns.  I called and spoke with patient.    He last sexual encounter was the weekend of 10/16/23.  This past Tuesday 10/20/2023, he started having symptoms with anal pain.  He also said that he wore the cock ring of his partner and he has bumps around where he wore the ring and also in his testicular area where he had shaved but they are different then normal bumps he gets at times with shaving.  He reports foul smelling anal drainage and that the bumps are painful that he his penis hurts a lot.    Given that it is late on Friday afternoon and it seems very likely he does have and STI and is very uncomfortable, I suggested that he go to an urgent care or ED.  He works from [employer] to 2am and said he would get someone to take him to the ED after work and let us  know on Monday how it goes.

## 2023-10-23 NOTE — Unmapped (Signed)
 Medical Case Management Social Work Note    Duration of Intervention: 30 minutes    TYPE OF CONTACT: Phone    ASSESSMENT: SW checked in regarding mental health Crisis     INTERVENTION:  The patient remains with a low mood and heightened anxiety due to his social stressors. The patient still denies a plan and want to actively harm himself. The patient still plans to attend work to complete his scheduled assigned work shifts. The patient was made aware that thoughts of self harm must be taken seriously and could involve involuntary commitment. The patient reassured staff that he was fine and the feelings were more intense yesterday because of the arguments with family. He states that everything was still very fresh and he was emotional.      SW called the patient later in the day to check in with him on his walk to work. The patient did not answer. SW will attempt to reach the patient again. If SW is unable to reach the patient, SW will have the appropriate personnel to conduct a wellness check.      PLAN:  SW plans to closely monitor the patient and inform his medical provider regarding the passive suicidal ideation.    UPDATE: SW spoke with the patient. The patient was walking to work at the time of the interaction. The patient further explains that he is not taking his medications for PTSD or HIV. It has been one week since he has taken the medication. SW discussed the importance of medication adherence. The patient shared that he was previously undetectable and had spoken with his medical provider regarding starting the injectable ART medication.The patient stated that he plans to restart his medications tomorrow. The patient also shared concerns regarding a potential STD/STI. He shared that he has bumps in his groin area and it is painful. SW informed the patient that a message would be sent to the nursing staff.      SW was advised to inform the patient to go to the urgent care rather than waiting to come into the ID clinic. SW attempted to call the patient back; however, he did not answer. SW informed the nursing staff that the patient was unavailable.        Patient plans to go to [facility] Emergency room instead due to the cost of the urgent care. He shares that the urgent care will charge him a copay and he does not have the money to pay to be seen at the moment.        Additional glasses info: The patient shared that his glasses insurance company covers $312 towards his lenses and frames. Previous frames were transition.        Patient would like to reschedule his PCP appointment.      [name], LCSW-A  [facility] ID Clinic Social Work

## 2023-10-26 DIAGNOSIS — F4312 Post-traumatic stress disorder, chronic: Principal | ICD-10-CM

## 2023-10-26 DIAGNOSIS — F341 Dysthymic disorder: Principal | ICD-10-CM

## 2023-10-26 MED ORDER — CITALOPRAM 40 MG TABLET
ORAL_TABLET | Freq: Every day | ORAL | 11 refills | 30.00000 days | Status: CP
Start: 2023-10-26 — End: ?

## 2023-10-26 NOTE — Unmapped (Signed)
 Medication Requested: citalopram       Future Appointments   Date Time Provider Department Center   11/30/2023  1:00 PM Selma Montie Massa, MD [facility]     Per Provider Note:   This medication is included in this patients clinical summary.    Standing order protocol requirements met?: Yes    Sent to: Pharmacy per protocol    Days Supply Given: 30 days  Number of Refills: 2

## 2023-10-29 NOTE — Unmapped (Signed)
 The [facility] has made a second and final attempt to reach this patient to refill the following medication:BIKTARVY  50-200-25 mg tablet (bictegrav-emtricit-tenofov ala).      We have left voicemails on the following phone numbers: [phone], have sent a MyChart message, and have sent a text message to the following phone numbers: [phone].    Dates contacted: 10/20/2023 and 10/29/2023  Last scheduled delivery: 09/28/2023    The patient may be at risk of non-compliance with this medication. The patient should call the [facility] at [phone]  Option 4, then Option 4: Infectious Disease, Transplant to refill medication.    Roger Bowers   [facility] Oncologist

## 2023-10-29 NOTE — Unmapped (Signed)
 Left message for patient to call [facility] pharmacy to arrange shipment of meds.  Phone number to call for pharmacy left on message.    Roger Bowers

## 2023-11-27 NOTE — Unmapped (Signed)
 Medical Case Management Social Work Note    Duration of Intervention: 15 minutes    TYPE OF CONTACT: Phone    ASSESSMENT: SW contacted the patient to check in and also remind him of his upcoming medical appointment.     INTERVENTION:  SW spoke with the patient in detail. The patient stated that he is doing well. SW reminded the patient of the upcoming appointment. SW also checked in regarding his mental state and goal progress.      PLAN: SW will continue to speak with the patient regarding his [facility] goals and progress during his upcoming appointment. The patient plans to attend his medical appointment utilizing the bus.     [name], LCSW-A  [facility] ID Clinic Social Work

## 2023-11-30 ENCOUNTER — Encounter
Admit: 2023-11-30 | Discharge: 2023-11-30 | Payer: Medicare (Managed Care) | Attending: Infectious Disease | Primary: Infectious Disease

## 2023-11-30 DIAGNOSIS — Z79899 Other long term (current) drug therapy: Principal | ICD-10-CM

## 2023-11-30 DIAGNOSIS — K6289 Other specified diseases of anus and rectum: Principal | ICD-10-CM

## 2023-11-30 DIAGNOSIS — B20 Human immunodeficiency virus [HIV] disease: Principal | ICD-10-CM

## 2023-11-30 DIAGNOSIS — F4312 Post-traumatic stress disorder, chronic: Principal | ICD-10-CM

## 2023-11-30 DIAGNOSIS — Z23 Encounter for immunization: Principal | ICD-10-CM

## 2023-11-30 DIAGNOSIS — Z8619 Personal history of other infectious and parasitic diseases: Principal | ICD-10-CM

## 2023-11-30 DIAGNOSIS — Z5181 Encounter for therapeutic drug level monitoring: Principal | ICD-10-CM

## 2023-11-30 DIAGNOSIS — A749 Chlamydial infection, unspecified: Principal | ICD-10-CM

## 2023-11-30 DIAGNOSIS — F341 Dysthymic disorder: Principal | ICD-10-CM

## 2023-11-30 DIAGNOSIS — Z113 Encounter for screening for infections with a predominantly sexual mode of transmission: Principal | ICD-10-CM

## 2023-11-30 DIAGNOSIS — Z1159 Encounter for screening for other viral diseases: Principal | ICD-10-CM

## 2023-11-30 DIAGNOSIS — Z7289 Other problems related to lifestyle: Principal | ICD-10-CM

## 2023-11-30 LAB — CBC W/ AUTO DIFF
BASOPHILS ABSOLUTE COUNT: 0 10*9/L (ref 0.0–0.1)
BASOPHILS RELATIVE PERCENT: 0.9 %
EOSINOPHILS ABSOLUTE COUNT: 0.2 10*9/L (ref 0.0–0.5)
EOSINOPHILS RELATIVE PERCENT: 5 %
HEMATOCRIT: 41 % (ref 39.0–48.0)
HEMOGLOBIN: 13.1 g/dL (ref 12.9–16.5)
LYMPHOCYTES ABSOLUTE COUNT: 2.4 10*9/L (ref 1.1–3.6)
LYMPHOCYTES RELATIVE PERCENT: 51.4 %
MEAN CORPUSCULAR HEMOGLOBIN CONC: 32 g/dL (ref 32.0–36.0)
MEAN CORPUSCULAR HEMOGLOBIN: 26.4 pg (ref 25.9–32.4)
MEAN CORPUSCULAR VOLUME: 82.4 fL (ref 77.6–95.7)
MEAN PLATELET VOLUME: 7.8 fL (ref 6.8–10.7)
MONOCYTES ABSOLUTE COUNT: 0.4 10*9/L (ref 0.3–0.8)
MONOCYTES RELATIVE PERCENT: 9.6 %
NEUTROPHILS ABSOLUTE COUNT: 1.5 10*9/L — ABNORMAL LOW (ref 1.8–7.8)
NEUTROPHILS RELATIVE PERCENT: 33.1 %
PLATELET COUNT: 217 10*9/L (ref 150–450)
RED BLOOD CELL COUNT: 4.98 10*12/L (ref 4.26–5.60)
RED CELL DISTRIBUTION WIDTH: 15.5 % — ABNORMAL HIGH (ref 12.2–15.2)
WBC ADJUSTED: 4.6 10*9/L (ref 3.6–11.2)

## 2023-11-30 LAB — LYMPH MARKER LIMITED,FLOW
ABSOLUTE CD3 CNT: 1824 {cells}/uL (ref 915–3400)
ABSOLUTE CD4 CNT: 936 {cells}/uL (ref 510–2320)
ABSOLUTE CD8 CNT: 816 {cells}/uL (ref 180–1520)
CD3% (T CELLS): 76 % (ref 61–86)
CD4% (T HELPER): 39 % (ref 34–58)
CD4:CD8 RATIO: 1.1 (ref 0.9–4.8)
CD8% T SUPPRESR: 34 % (ref 12–38)

## 2023-11-30 NOTE — Unmapped (Signed)
 PROMIS Tablet Screening  Completed Date: 11/30/2023     SW reviewed self-administered screening.    Patient had a PHQ-9 score of 0 indicating No depression.     Over the last 2 weeks, how often have you been bothered by any of the following problems? Not at all Several  days More  than half  the day Nearly  every  day   1. Little interest or pleasure in doing things 0 1 2 3    2. Feeling down, depressed, or hopeless 0 1 2 3    3. Trouble falling or staying asleep, or sleeping too much 0 1 2 3    4. Feeling tired or having little energy 0 1 2 3    5. Poor appetite or overeating 0 1 2 3    6. Feeling bad about yourself -- or that you are a failure or  have let yourself or your family down 0 1 2 3    7. Trouble concentrating on things, such as reading the  newspaper or watching television 0 1 2 3    8. Moving or speaking so slowly that other people could have  noticed? Or the opposite -- being so fidgety or restless  that you have been moving around a lot more than usual 0 1 2 3    9. Thoughts that you would be better off dead or of hurting  yourself in some way 0 1 2 3    Total: 0     Depression score scale:  0-4: considered no depression  5-9: considered mild depression.  10-14: considered moderate depression  15-19: considered moderately severe depression.  20-27: considered severe depression.     Pt denies SI.   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  Pt denies concerns for IPV.    Pt seen by provider for regular ID visit.  Pt was seen in person by Social Work during this visit.     [name], LCSW-A  [facility] ID Clinic Social Work

## 2023-11-30 NOTE — Unmapped (Signed)
 RD screened pt for food insecurity during ID provider visit. Pt screens positive and notes transportation as biggest barrier to achieving food security. Pt does not have SNAP benefits nor accesses a food pantry.   Pt notes lack stable social support for transportation to stores and food pantries. He mentions a cousin who occasionally helps on some Saturdays, but this is not a reliable or consistent source of assistance    Interventions given:  Paper SNAP application  Food pantries in [place], [place] open Saturdays  RW grocery bag      Social Work/Dietitian Food Pantry Assessment    Food Insecurity Screening:     I'm going to read you two statements that people have made about their food situation. For each statement, please tell me whether the statement was often true, sometimes true, or never true for your household in the 12 months.      1. ???We worried whether our food would run out before we got money to buy more.???   Often True     2. ???The food that we bought just didn't last, and we didn't have money to get more.  Often True    2. Does the patient receive SNAP (food stamps)?  No  3. Does the patient access a food pantry?  No      **IF patient answers NO to answers 2 and 3, provide patient with SNAP application and food pantries locations in their surrounding area**    Additional Questions:    1. Patient income level: <300% FPL    2. Has the patient's income changed since last RW application? stayed the same    3.  Has the patient completed RW enrollment paperwork: Yes      **If pt answer no to 3, have the patient speak with a RW benefit coordinator before they can use food pantry**    4. The patient is Eligible for the Food Pantry Program.       **Roger Bowers has given pt food bag as means of emergency foods/last resort. All other methods to receive food were exhausted prior to receiving food bag.    Barriers to Care: transportation    Time of Intervention: 8 minutes    Roger DELENA Areola, MS, RD, LDN
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
## 2023-11-30 NOTE — Unmapped (Signed)
 Name: Aveer Bartow  Date: 11/30/2023  Address: [address]  [address]  [place] [address]   [place] of Residence:  [place]  Phone: [phone]     Started assessment with patient options: in clinic    Is this the same address for mailing? No- [address]  If no, Mailing Address is:     What is your preferred method of contact? Phone Call, email, mychart    Is there anyone that you would want to add as your personal contact? No; if yes, please use SmartPhrase RWPersonalContactInfo to gather their contacts information.    N/a    Housing Status  Stable/Permanent; if so, what is their housing type: Permanent placement with families or other self - sufficient arrangements     Insurance  Medicaid and Medicare Part C    Marital Status  Single    Tax Filing Status  Single    Employment Status  Medically Unable to Work    Income  Tree surgeon (Retirement/Survivor's/Disability)    If no or low income, how are you meeting your basic needs?  Family Support    List Tax Household Members including relationship to you:   N/a    Someone in my household receives: No Household Income/Deductions of any kind  Specify who: n/a    Do you or anyone in your home have income adjustments? No    If yes, which adjustments do you have? N/A      Medication Access/Barriers: n/a    Do you have a current diagnosis for Hepatitis C?  Lab Results   Component Value Date    HEPCAB Nonreactive 03/30/2023       Federal Marketplace Eligibility Assessment  Patient has affordable insurance through [employer], [place], and or Employment and is not eligible.    Patient given ACA education if they qualified based on answers to questions above.     MyChart  Do you have an active MyChart account? Yes     If MyChart is not set up, informed patient on how to set up MyChart N/A    Patient was informed of the following programs;   N/A    The following applications/handouts were given to patient:   N/A    The following forms were also started with the patient:   N/A    Medicaid:  N/A      Ryan White/HMAP application status: Complete    Patient is applying for [employer] on Charges Only     Additional Comments: No issues accessing meds. Patient would like to use their 2024 ssi letter and will contact Benefits if the amount changes.    ____________________________________________________________________    The patient provides verbal consent and agrees to the following:    RW Consent  I agree to notify the interviewer within 30 days about any changes to my address, financial resources, expenses, family situation, or health insurance coverage that may impact my eligibility for Department payment programs. I certify that the information I have provided is accurate and complete to the best of my knowledge. I understand that information provided may be verified by a state reviewer, and I agree to submit any necessary financial records required for this review. I also understand that my employer may be asked to verify information concerning my income.    Caps on Charges Consent  The [employer] Program requires that both insured and uninsured individuals be charged no more than a specified maximum  amount in a calendar year, based on their individual income.                Katie Das  ID Clinic Benefits Counselor  Time of Intervention-24mins

## 2023-12-01 DIAGNOSIS — A539 Syphilis, unspecified: Principal | ICD-10-CM

## 2023-12-01 LAB — HIV RNA, QUANTITATIVE, PCR: HIV RNA QNT RSLT: NOT DETECTED

## 2023-12-01 LAB — RPR, THERAPY MONITORING: SYPHILIS RPR SCREEN: REACTIVE — AB

## 2023-12-01 LAB — HEPATITIS C ANTIBODY
HCV S/CO VALUE: 0.14
HEPATITIS C ANTIBODY: NONREACTIVE

## 2023-12-01 NOTE — Unmapped (Signed)
 Patient completed [name] only application.. Eligible for RW B&C grant services and Caps on Charges. IPL= 74%, FPL= 74%. Expires 02/16/2025- RW0- submitted     RW Eligibility Form informing patient about RW services and Caps on charges was sent to patient via MyChart Message              Roger Bowers  ID Clinic Benefits Counselor  Time of Intervention-59mins

## 2023-12-01 NOTE — Unmapped (Signed)
 RPR 1:2 after nonreactive in July 2025. Pt with persistent rectal pain, external exam normal. GC/CT negative x3.    Sent mychart message to recommend treatment for syphilis with unprotected sexual exposure, symptoms after. Would expect higher RPR, but given other testing negative favor treating.    Order placed. Mychart message sent to patient.

## 2023-12-03 DIAGNOSIS — A539 Syphilis, unspecified: Principal | ICD-10-CM

## 2023-12-03 MED ORDER — DOXYCYCLINE MONOHYDRATE 100 MG CAPSULE
ORAL_CAPSULE | Freq: Two times a day (BID) | ORAL | 0 refills | 28.00000 days | Status: CP
Start: 2023-12-03 — End: 2023-12-31

## 2023-12-04 NOTE — Unmapped (Signed)
 Medical Case Management Social Work Note    Duration of Intervention: 30 minutes    TYPE OF CONTACT: Phone and MyChart    ASSESSMENT: Patient contacted SW to provide an update regarding his housing goal.    INTERVENTION:  SW spoke with the patient in detail regarding his progress. The patient explained that he was approved for the apartment that she showed staff during his last appointment. The patient states that he has the approval letter and he may walk to the office to provide a copy. The patient shares that he needs assistance with the deposit for the property. The patient states that the deposit is $200, and the rent would be $700. He is still waiting to have his SSI reinstated. In the meantime, the patient has been applying for jobs. SW informed the patient that a call would be made to [facility] to inquire about next steps.     SW contacted the patient's region for additional information. The call went unanswered. SW left a message requesting a phone call back regarding next steps.     SW called to update the patient. SW will call the patient again once additional information is learned.     The patient decided to send the letter via Mychart.     UPDATE: The patient called SW staff back and stated that the landlord wanted the payment in cash. SW explained that her request most likely would not be honored by the program. SW further explained that the agency would be responsible for how they move forward with the payment if applicable.     PLAN:  SW and patient is awaiting a call back from [facility].      UPDATE: The patient left a voicemail for SW regarding his housing situation. The patient states that he would like to retract the movement towards his housing goal due to his financial situation. The patient mentions that since he does not currently receive his SSI check, he would not have enough funds to support moving into the property. The patient would like to remain on the [employer] waitlist for housing through their program.     [name], LCSW-A  [facility] Social Work

## 2023-12-09 NOTE — Unmapped (Signed)
 The [facility] has made a third and final attempt to reach this patient to refill the following medication: Biktarvy .      We have left voicemails on the following phone numbers: [phone], have sent a text message to the following phone numbers: [phone], and have sent a Mychart questionnaire..    Dates contacted: 9/2, 9/11, 10/22  Last scheduled delivery: 09/28/23 (30 day supply)    The patient may be at risk of non-compliance with this medication. The patient should call the [facility] at [phone]  Option 4, then Option 4: Infectious Disease, Transplant to refill medication.    Aleck CHRISTELLA Gaskins, PharmD   [facility] Specialty Pharmacist

## 2023-12-09 NOTE — Unmapped (Signed)
 [facility] Refill Coordination Note    Specialty Medication(s) to be Shipped:   Infectious Disease: Biktarvy     Other medication(s) to be shipped: No additional medications requested for fill at this time    Specialty Medications not needed at this time: N/A     Roger Bowers, DOB: 18-Dec-1990  Phone: There are no phone numbers on file.      All above HIPAA information was verified with patient.     Was a Nurse, learning disability used for this call? No    Completed refill call assessment today to schedule patient's medication shipment from the [facility]  [phone]).  All relevant notes have been reviewed.     Specialty medication(s) and dose(s) confirmed: Regimen is correct and unchanged.   Changes to medications: Leshawn reports no changes at this time.  Changes to insurance: No  New side effects reported not previously addressed with a pharmacist or physician: None reported  Questions for the pharmacist: No    Confirmed patient received a Conservation officer, historic buildings and a Surveyor, mining with first shipment. The patient will receive a drug information handout for each medication shipped and additional FDA Medication Guides as required.       DISEASE/MEDICATION-SPECIFIC INFORMATION        N/A    SPECIALTY MEDICATION ADHERENCE     Medication Adherence    Patient reported X missed doses in the last month: 0  Specialty Medication: BIKTARVY  50-200-25 mg tablet (bictegrav-emtricit-tenofov ala)  Patient is on additional specialty medications: No              Were doses missed due to medication being on hold? No      BIKTARVY  50-200-25 mg tablet (bictegrav-emtricit-tenofov ala): 14 days of medicine on hand       REFERRAL TO PHARMACIST     Referral to the pharmacist: Not needed      [facility]     Shipping address confirmed in Epic.     Cost and Payment: Patient has a $0 copay, payment information is not required.    Delivery Scheduled: Yes, Expected medication delivery date: 12/15/23.     Medication will be delivered via Same Day Courier to the prescription address in Epic WAM.    [facility]  Specialty Technician

## 2023-12-15 MED FILL — BIKTARVY 50 MG-200 MG-25 MG TABLET: ORAL | 30 days supply | Qty: 30 | Fill #2

## 2023-12-15 NOTE — Progress Notes (Signed)
 Medical Case Management Social Work Note    Duration of Intervention: 30 minutes    TYPE OF CONTACT: Phone and E-mail    ASSESSMENT: SW contacted the patient to check in on goal progress.     INTERVENTION:  SW spoke with the patient in detail. The patient informed SW that instead of canceling the housing process. He would like to continue to work with [employer] for rental assistance. SW informed the patient that no additional information has been learned at this time. The patient has paid for the application fee and has been approved. He is not requesting assistance with the deposit.     The patient has accomplished his goal of finding additional employment. He has been offered a job at [employer]. At the time of the interaction, the patient was completing his orientation. The patient states that he will be working between the 15-501 and the 54/55 location.     PLAN:  SW called Tobias directly and left a message requesting a return phone call. SW also called the main social work office at [phone]; however, there was not an option for [employer]. SW plans to contact the [employer] by email since all phone calls have gone unanswered and no return calls have been received. The patient will be contacted once additional information is available.     The patient is currently scheduled to work on Wed, [place], and Sat from 3-11 pm. He further shares that he will be making $15 per hour. The referral was successfully faxed and received by the agency. The patient will be contacted by [facility]. The coordinator was also informed about the patient's work schedule.       [name], LCSW-A  [facility] Social Work

## 2023-12-21 NOTE — Progress Notes (Signed)
 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     INTERVENTION:  SW spoke with the patient briefly regarding the patient???s progress. The patient informed SW staff that he completed his scheduled meeting with Tobias from region 6. The patient also explained that he is continuing to contact the Social Security office in reference to his renewed application. SW reminded the patient that, due to the government shutdown, things may be delayed regarding his application. SW encouraged the patient to continue to call.     The patient also shared that his job is going well.     PLAN:  The patient also shared that his job is going well. He is hopeful that his new income will help to support his new apartment; however, the [employer] coordinator advised him to wait until he starts receiving it before moving forward with housing.     UPDATE:The patient called back and informed SW staff that he got in contact with SSI. He was informed that his paperwork was received back by the state on 10/29. He was also informed that he would be contacted back but he would need to wait 216 days( August 02, 2024). The patient plans to continue to work and save his money to afford his new housing.  SW will follow up again with the patient at a later time.      [name], LCSW-A  [facility] Social Work

## 2023-12-23 NOTE — Progress Notes (Signed)
 Medical Case Management Social Work Note    Duration of Intervention: 30 minutes    TYPE OF CONTACT: Phone    ASSESSMENT: SW received a call from the patient. The patient provided SW with an update regarding housing, [employer], and employment.     INTERVENTION:  SW spoke with the patient in detail regarding new housing information. The patient shares that he will again decide to wait on his apartment and assistance through [employer]. The patient stated that he has informed the landlord and the coordinator regarding his decision. The patient has decided that once he receives his SSI, he will continue to work with the [employer]).      PLAN:  Although the patient states that he will be waiting before securing an apartment, the patient informed SW that he will be moving into a different unit provided by the same landlord. The patient will be paying $700 for rent with utilities included. Since the patient is just starting to work, they have agreed on $350 from his first paycheck and he will then pay the rest later.The patient also requested assistance with finding funding resources during the call. It is unclear if the patient will move to the location, change his mind again, or have enough resources to sustain the unit. The patient did mention that he signed a lease. The patient plans to keep SW updated on the situation.     [name], LCSW-A  [facility] Social Work

## 2024-01-12 NOTE — Progress Notes (Addendum)
 The [facility] has made a second and final attempt to reach this patient to refill the following medication:BIKTARVY  50-200-25 mg tablet (bictegrav-emtricit-tenofov ala).      We have left voicemails on the following phone numbers: [phone], have sent a MyChart message, have sent a text message to the following phone numbers: [phone], and have sent a Mychart questionnaire..    Dates contacted: 01/06/2024  01/12/2024  Last scheduled delivery: 12/16/2023    The patient may be at risk of non-compliance with this medication. The patient should call the [facility] at [phone]  Option 4, then Option 4: Infectious Disease, Transplant to refill medication.    Roger Bowers Roger Bowers Roger Bowers

## 2024-01-15 NOTE — ED Triage Note (Addendum)
 Ate 4 bowls of potato salad yesterday, woke up feeling chills and headache. Took ibuprofen this morning and pepto bismol. Denies abd pain. No fever in triage. No N/V/D. Requesting a look at rectal discomfort and STD testing too.

## 2024-01-16 ENCOUNTER — Emergency Department
Admit: 2024-01-16 | Discharge: 2024-01-16 | Disposition: A | Discharge status: Home / Self Care | Payer: Medicare (Managed Care)

## 2024-01-16 LAB — CBC W/ AUTO DIFF
BASOPHILS ABSOLUTE COUNT: 0.1 10*9/L (ref 0.0–0.1)
BASOPHILS RELATIVE PERCENT: 0.5 %
EOSINOPHILS ABSOLUTE COUNT: 0.2 10*9/L (ref 0.0–0.5)
EOSINOPHILS RELATIVE PERCENT: 2 %
HEMATOCRIT: 40.8 % (ref 39.0–48.0)
HEMOGLOBIN: 13.7 g/dL (ref 12.9–16.5)
LYMPHOCYTES ABSOLUTE COUNT: 2.8 10*9/L (ref 1.1–3.6)
LYMPHOCYTES RELATIVE PERCENT: 27.7 %
MEAN CORPUSCULAR HEMOGLOBIN CONC: 33.7 g/dL (ref 32.0–36.0)
MEAN CORPUSCULAR HEMOGLOBIN: 27.5 pg (ref 25.9–32.4)
MEAN CORPUSCULAR VOLUME: 81.6 fL (ref 77.6–95.7)
MEAN PLATELET VOLUME: 7.8 fL (ref 6.8–10.7)
MONOCYTES ABSOLUTE COUNT: 0.8 10*9/L (ref 0.3–0.8)
MONOCYTES RELATIVE PERCENT: 7.5 %
NEUTROPHILS ABSOLUTE COUNT: 6.3 10*9/L (ref 1.8–7.8)
NEUTROPHILS RELATIVE PERCENT: 62.3 %
PLATELET COUNT: 207 10*9/L (ref 150–450)
RED BLOOD CELL COUNT: 5 10*12/L (ref 4.26–5.60)
RED CELL DISTRIBUTION WIDTH: 14.6 % (ref 12.2–15.2)
WBC ADJUSTED: 10.2 10*9/L (ref 3.6–11.2)

## 2024-01-16 LAB — LYMPH MARKER LIMITED,FLOW
ABSOLUTE CD3 CNT: 2156 {cells}/uL (ref 915–3400)
ABSOLUTE CD4 CNT: 1120 {cells}/uL (ref 510–2320)
ABSOLUTE CD8 CNT: 952 {cells}/uL (ref 180–1520)
CD3% (T CELLS): 77 % (ref 61–86)
CD4% (T HELPER): 40 % (ref 34–58)
CD4:CD8 RATIO: 1.2 (ref 0.9–4.8)
CD8% T SUPPRESR: 34 % (ref 12–38)

## 2024-01-16 LAB — COMPREHENSIVE METABOLIC PANEL
ALBUMIN: 3.9 g/dL (ref 3.4–5.0)
ALKALINE PHOSPHATASE: 69 U/L (ref 46–116)
ALT (SGPT): 10 U/L (ref 10–49)
ANION GAP: 12 mmol/L (ref 5–14)
AST (SGOT): 15 U/L (ref <=34)
BILIRUBIN TOTAL: 0.5 mg/dL (ref 0.3–1.2)
BLOOD UREA NITROGEN: <5 mg/dL — ABNORMAL LOW (ref 9–23)
CALCIUM: 8.9 mg/dL (ref 8.7–10.4)
CHLORIDE: 103 mmol/L (ref 98–107)
CO2: 25 mmol/L (ref 20.0–31.0)
CREATININE: 0.94 mg/dL (ref 0.73–1.18)
EGFR CKD-EPI (2021) MALE: >90 mL/min/1.73m2 (ref >=60)
GLUCOSE RANDOM: 94 mg/dL (ref 70–179)
POTASSIUM: 3.9 mmol/L (ref 3.4–4.8)
PROTEIN TOTAL: 6.9 g/dL (ref 5.7–8.2)
SODIUM: 140 mmol/L (ref 135–145)

## 2024-01-16 LAB — LIPASE: LIPASE: 28 U/L (ref 12–53)

## 2024-01-16 LAB — SYPHILIS SCREEN: SYPHILIS RPR SCREEN: NONREACTIVE

## 2024-01-16 MED ORDER — ONDANSETRON 4 MG DISINTEGRATING TABLET
ORAL_TABLET | Freq: Three times a day (TID) | ORAL | 0 refills | 5.00000 days | Status: CP | PRN
Start: 2024-01-16 — End: 2024-01-23

## 2024-01-16 MED ORDER — SENNOSIDES 8.6 MG-DOCUSATE SODIUM 50 MG TABLET
ORAL_TABLET | Freq: Every day | ORAL | 0 refills | 20.00000 days | Status: CP
Start: 2024-01-16 — End: 2024-02-05

## 2024-01-16 MED ORDER — HYDROCORTISONE 2.5 % TOPICAL CREAM WITH PERINEAL APPLICATOR
Freq: Two times a day (BID) | RECTAL | 0 refills | 0.00000 days | Status: CP
Start: 2024-01-16 — End: ?

## 2024-01-16 MED ORDER — POLYETHYLENE GLYCOL 3350 17 GRAM ORAL POWDER PACKET
PACK | Freq: Every day | ORAL | 2 refills | 30.00000 days | Status: CP
Start: 2024-01-16 — End: 2024-04-15

## 2024-01-16 MED ADMIN — sodium chloride 0.9% (NS) bolus 500 mL: 500 mL | INTRAVENOUS | @ 05:00:00 | Stop: 2024-01-16

## 2024-01-16 MED ADMIN — prochlorperazine (COMPAZINE) injection 5 mg: 5 mg | INTRAVENOUS | @ 05:00:00 | Stop: 2024-01-16

## 2024-01-16 MED ADMIN — acetaminophen (TYLENOL) tablet 1,000 mg: 1000 mg | ORAL | @ 05:00:00 | Stop: 2024-01-16
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
## 2024-01-18 LAB — HIV RNA, QUANTITATIVE, PCR: HIV RNA QNT RSLT: NOT DETECTED

## 2024-02-02 NOTE — Progress Notes (Signed)
 [facility] Refill Coordination Note    Specialty Medication(s) to be Shipped:   Infectious Disease: Biktarvy     Other medication(s) to be shipped: No additional medications requested for fill at this time    Specialty Medications not needed at this time: N/A     Roger Bowers, DOB: 22-Nov-1990  Phone: There are no phone numbers on file.      All above HIPAA information was verified with patient.     Was a nurse, learning disability used for this call? No    Completed refill call assessment today to schedule patient's medication shipment from the [facility]  [phone]).  All relevant notes have been reviewed.     Specialty medication(s) and dose(s) confirmed: Regimen is correct and unchanged.   Changes to medications: Tracer reports no changes at this time.  Changes to insurance: No  New side effects reported not previously addressed with a pharmacist or physician: None reported  Questions for the pharmacist: No    Confirmed patient received a Conservation Officer, Historic Buildings and a Surveyor, Mining with first shipment. The patient will receive a drug information handout for each medication shipped and additional FDA Medication Guides as required.       DISEASE/MEDICATION-SPECIFIC INFORMATION        N/A    SPECIALTY MEDICATION ADHERENCE     Medication Adherence    Patient reported X missed doses in the last month: 0  Specialty Medication: BIKTARVY  50-200-25 mg tablet (bictegrav-emtricit-tenofov ala)  Patient is on additional specialty medications: No  Informant: patient              Were doses missed due to medication being on hold? No    Biktarvy  50-200-25 mg: 21 days of medicine on hand       Specialty medication is an injection or given on a cycle: No    REFERRAL TO PHARMACIST     Referral to the pharmacist: Not needed      [facility]     Shipping address confirmed in Epic.     Cost and Payment: Patient has a $0 copay, payment information is not required.    Delivery Scheduled: Yes, Expected medication delivery date: 02/16/24.     Medication will be delivered via Next Day Courier to the prescription address in Epic WAM.    Roger Bowers, PharmD   [facility]  Specialty Pharmacist

## 2024-02-10 NOTE — Progress Notes (Signed)
Medical Case Management Attempt Social Work Note     Duration of Intervention: 5 minutes    REASON/TYPE OF CONTACT: Phone    INTERVENTION:  SW attempted to contact for services but no answer. This SW left a discreet VM requesting a return phone call.    PLAN:  SW will try to contact again at a later date.    Sofie Rower, LCSW-A  [facility] Social Work

## 2024-02-15 MED FILL — BIKTARVY 50 MG-200 MG-25 MG TABLET: ORAL | 30 days supply | Qty: 30 | Fill #3

## 2024-03-15 NOTE — Progress Notes (Unsigned)
 Medical Case Management Social Work Note    Duration of Intervention: 25 minutes    TYPE OF CONTACT: Phone    ASSESSMENT: SW met with the patient to complete scheduled [facility] check-in     INTERVENTION:    The patient has not been employed recently.  Received back pay of $2,770 in November.  Currently receiving $1,426 in SSI income.  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  He has since moved to his friend's house.  The patient is actively trying to reach out to [facility] for further support.  His cousin provided him with a website for additional resources or assistance.  The patient has an eviction from 2022 after being taken to court, which will now impact his ability to secure housing moving forward.  Current Issues:  Housing: The patient is facing difficulties due to the eviction record and needs to find new housing.  Monthly Assistance: He receives $195  each month from his [employer], which has decreased from $225.  Feeling isolated from not working. SW suggested that the patient consider volunteering as a potential way to engage in meaningful activities and build connections during this transition.  Resources Provided:  The patient shared that he was experiencing food insecurity while waiting on his benefits to reload. SW provided the patient with [employer]: Available at [employer], [address] in [place] (on the [address]). Services are available by appointment, Monday-Friday. Contact: [phone] (Press 0 to schedule a pick-up time).  Plan/Next Steps:    PLAN:  Continue to support the patient in accessing resources and housing options.  Follow up with Tobias if no contact is made.  Encourage the patient to explore volunteer opportunities for added engagement.    [name], LCSW-A  [facility] Social Work

## 2024-03-18 NOTE — Progress Notes (Signed)
 The [facility] has made a second and final attempt to reach this patient to refill the following medication:BIKTARVY  50-200-25 mg tablet (bictegrav-emtricit-tenofov ala).      We have left voicemails on the following phone numbers: [phone], have sent a MyChart message, have sent a text message to the following phone numbers: [phone], and have sent a Mychart questionnaire..    Dates contacted: 03/15/2024  03/18/2624  Last scheduled delivery: 02/15/2024    The patient may be at risk of non-compliance with this medication. The patient should call the [facility] at [phone]  Option 4, then Option 4: Infectious Disease, Transplant to refill medication.    Roger Bowers

## 2024-03-23 NOTE — Telephone Encounter (Signed)
 Message in patient calls that pharmacy attempting to reach patient re: Biktarvy  Rx.  Contacted pharmacy, notified by pharmacy team that patient has a $12.65 copay.  RN attempted to contact patient by phone, unable to reach.

## 2024-03-23 NOTE — Progress Notes (Signed)
Medical Case Management Attempt Social Work Note     Duration of Intervention: 5 minutes    REASON/TYPE OF CONTACT: Phone    INTERVENTION:  SW attempted to contact for services but no answer. This SW left a discreet VM requesting a return phone call.    PLAN:  SW will try to contact again at a later date.    Sofie Rower, LCSW-A  [facility] Social Work
